# Patient Record
Sex: Male | Born: 1970 | Race: Black or African American | Hispanic: No | Marital: Married | Smoking: Former smoker
Health system: Southern US, Community
[De-identification: ages and names within clinical notes are randomized; demographics above are authoritative.]

## PROBLEM LIST (undated history)

## (undated) DIAGNOSIS — G4730 Sleep apnea, unspecified: Secondary | ICD-10-CM

## (undated) DIAGNOSIS — E785 Hyperlipidemia, unspecified: Secondary | ICD-10-CM

## (undated) HISTORY — DX: Hyperlipidemia, unspecified: E78.5

---

## 2013-06-04 ENCOUNTER — Other Ambulatory Visit: Payer: Self-pay | Admitting: Occupational Medicine

## 2013-06-04 ENCOUNTER — Ambulatory Visit: Payer: Self-pay

## 2013-06-04 DIAGNOSIS — R7612 Nonspecific reaction to cell mediated immunity measurement of gamma interferon antigen response without active tuberculosis: Secondary | ICD-10-CM

## 2013-11-20 HISTORY — PX: COLONOSCOPY: SHX174

## 2013-12-09 ENCOUNTER — Other Ambulatory Visit (HOSPITAL_COMMUNITY): Payer: Self-pay | Admitting: Family Medicine

## 2013-12-09 DIAGNOSIS — R109 Unspecified abdominal pain: Secondary | ICD-10-CM

## 2013-12-12 ENCOUNTER — Ambulatory Visit (HOSPITAL_COMMUNITY)
Admission: RE | Admit: 2013-12-12 | Discharge: 2013-12-12 | Disposition: A | Discharge status: Home / Self Care | Payer: 59 | Source: Ambulatory Visit | Attending: Family Medicine | Admitting: Family Medicine

## 2013-12-12 DIAGNOSIS — R109 Unspecified abdominal pain: Secondary | ICD-10-CM

## 2013-12-12 DIAGNOSIS — R1011 Right upper quadrant pain: Secondary | ICD-10-CM | POA: Insufficient documentation

## 2013-12-31 ENCOUNTER — Other Ambulatory Visit (HOSPITAL_COMMUNITY): Payer: Self-pay | Admitting: Gastroenterology

## 2013-12-31 DIAGNOSIS — R1011 Right upper quadrant pain: Secondary | ICD-10-CM

## 2014-01-09 ENCOUNTER — Encounter (HOSPITAL_COMMUNITY)
Admission: RE | Admit: 2014-01-09 | Discharge: 2014-01-09 | Disposition: A | Discharge status: Home / Self Care | Payer: 59 | Source: Ambulatory Visit | Attending: Gastroenterology | Admitting: Gastroenterology

## 2014-01-09 DIAGNOSIS — R1011 Right upper quadrant pain: Secondary | ICD-10-CM | POA: Insufficient documentation

## 2014-01-09 MED ORDER — TECHNETIUM TC 99M MEBROFENIN IV KIT
5.0000 | PACK | Freq: Once | INTRAVENOUS | Status: AC | PRN
  Administered 2014-01-09: 5 via INTRAVENOUS

## 2014-01-09 MED ORDER — STERILE WATER FOR INJECTION IJ SOLN
INTRAMUSCULAR | Status: AC
  Filled 2014-01-09: qty 10

## 2014-01-09 MED ORDER — SINCALIDE 5 MCG IJ SOLR
INTRAMUSCULAR | Status: AC
  Administered 2014-01-09: 1.6 ug via INTRAVENOUS
  Filled 2014-01-09: qty 5

## 2014-01-09 MED ORDER — SINCALIDE 5 MCG IJ SOLR: 0.0200 ug/kg | Freq: Once | INTRAMUSCULAR | Status: AC

## 2014-03-31 LAB — HM COLONOSCOPY

## 2014-12-23 ENCOUNTER — Other Ambulatory Visit (HOSPITAL_COMMUNITY): Payer: Self-pay | Admitting: Family Medicine

## 2014-12-23 DIAGNOSIS — R1011 Right upper quadrant pain: Secondary | ICD-10-CM

## 2014-12-25 ENCOUNTER — Ambulatory Visit (HOSPITAL_COMMUNITY)
Admission: RE | Admit: 2014-12-25 | Discharge: 2014-12-25 | Disposition: A | Discharge status: Home / Self Care | Payer: 59 | Source: Ambulatory Visit | Attending: Family Medicine | Admitting: Family Medicine

## 2014-12-25 ENCOUNTER — Encounter (HOSPITAL_COMMUNITY): Payer: Self-pay

## 2014-12-25 DIAGNOSIS — R1011 Right upper quadrant pain: Secondary | ICD-10-CM | POA: Diagnosis not present

## 2014-12-25 MED ORDER — IOHEXOL 300 MG/ML  SOLN
100.0000 mL | Freq: Once | INTRAMUSCULAR | Status: AC | PRN
  Administered 2014-12-25: 100 mL via INTRAVENOUS

## 2015-02-21 IMAGING — CT CT ABD-PELV W/ CM
2 of 5 series · 17 of 46 positions shown, 19 images · IV contrast (OMNIPAQUE)
Comparison: Ultrasound 12/12/2013

CLINICAL DATA: Right upper quadrant pain 2 years with recent
worsening.

EXAM:
CT ABDOMEN AND PELVIS WITH CONTRAST
TECHNIQUE: Multidetector CT imaging of the abdomen and pelvis was performed
using the standard protocol following bolus administration of
intravenous contrast.
CONTRAST:  100mL OMNIPAQUE IOHEXOL 300 MG/ML  SOLN

[Series 2: rtn a/p with · axial · 0.71mm/px · z∈[-504,-124]mm · 14 of 86 slices shown, 16 images]
[im 5/86  soft-tissue]
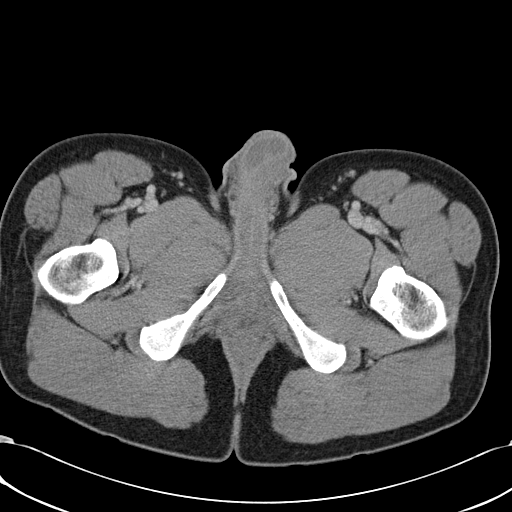
[im 5/86  bone]
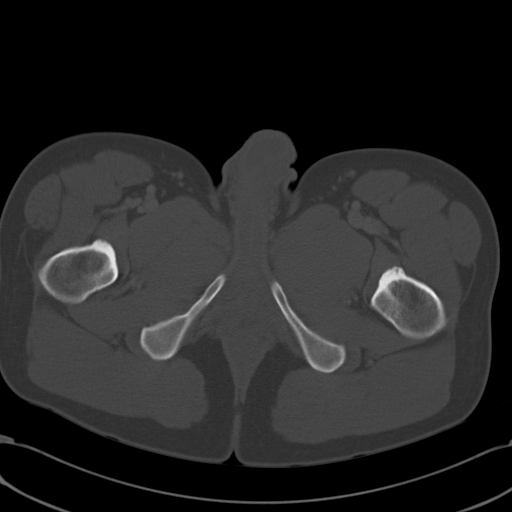
[im 10/86  soft-tissue]
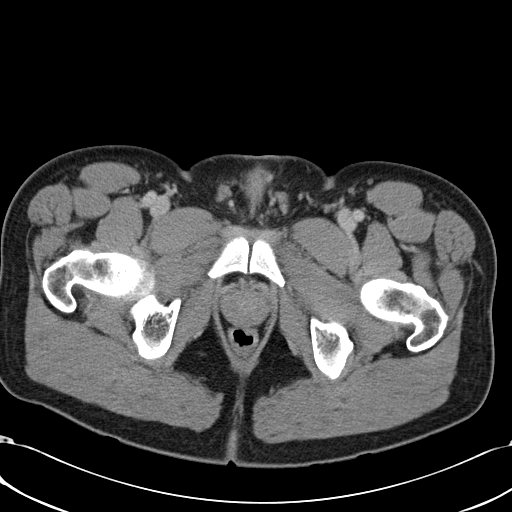
[im 19/86  soft-tissue]
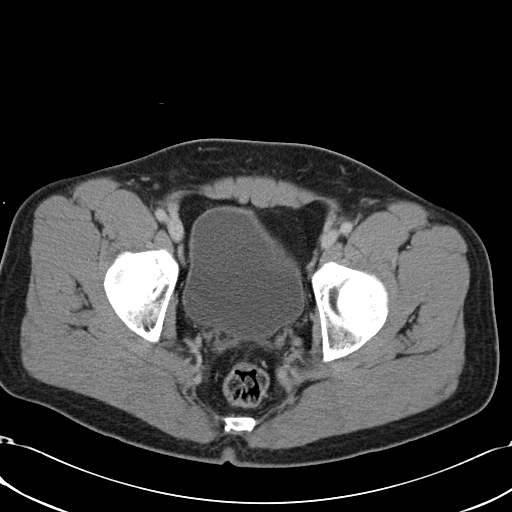
[im 24/86  soft-tissue]
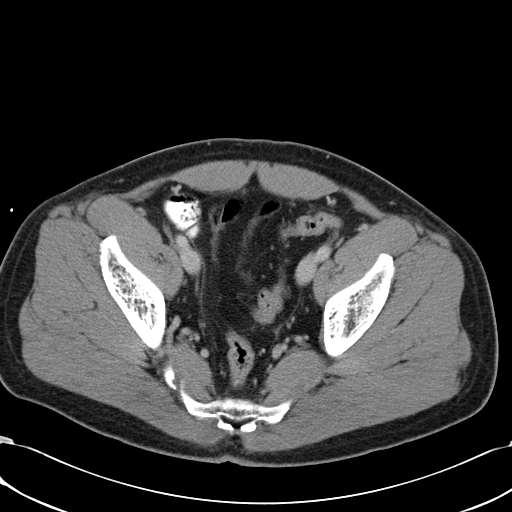
[im 29/86  soft-tissue]
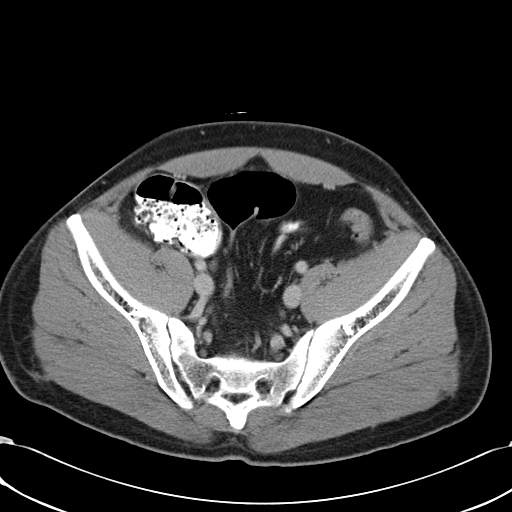
[im 34/86  soft-tissue]
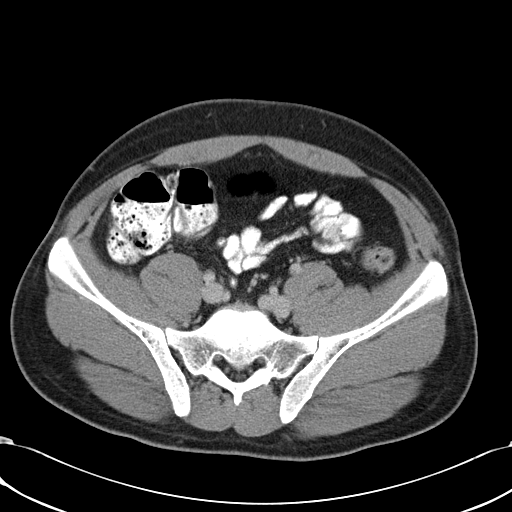
[im 38/86  soft-tissue]
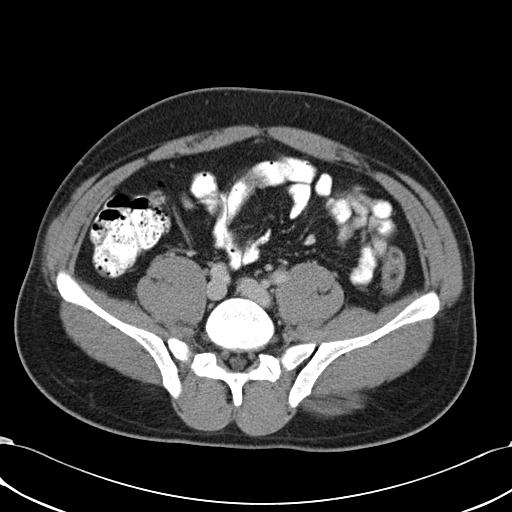
[im 48/86  soft-tissue]
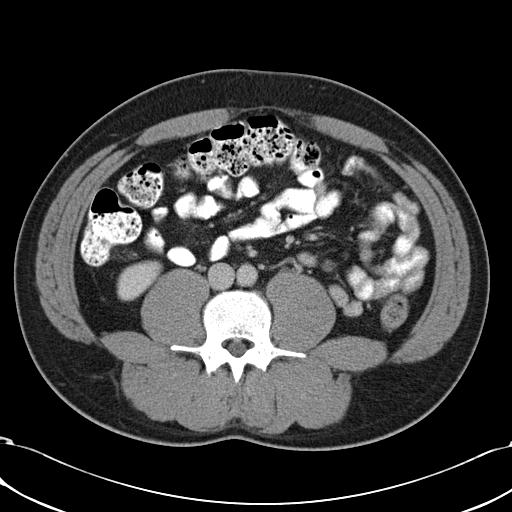
[im 52/86  soft-tissue]
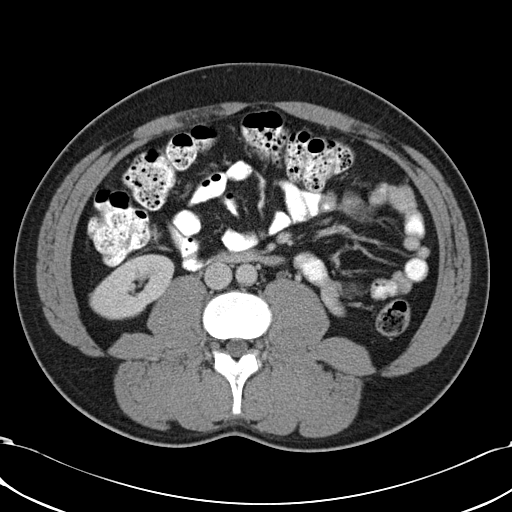
[im 52/86  bone]
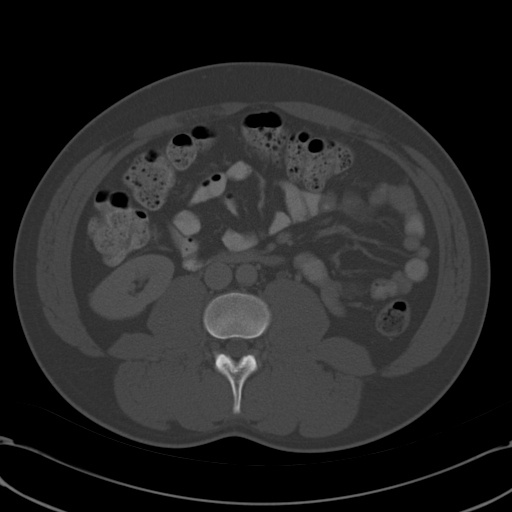
[im 57/86  soft-tissue]
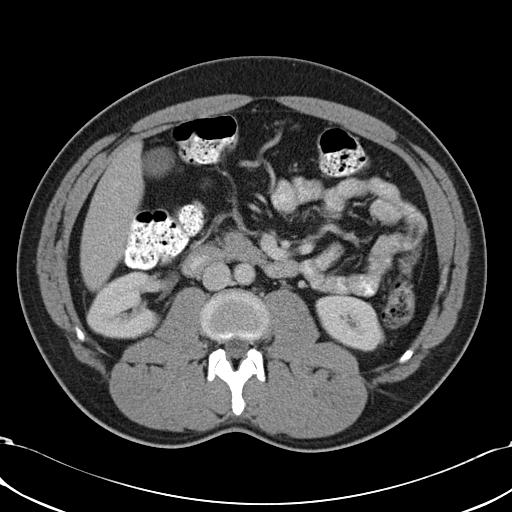
[im 62/86  soft-tissue]
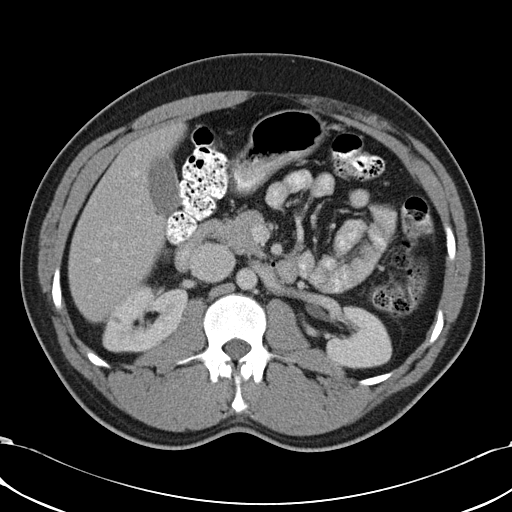
[im 67/86  soft-tissue]
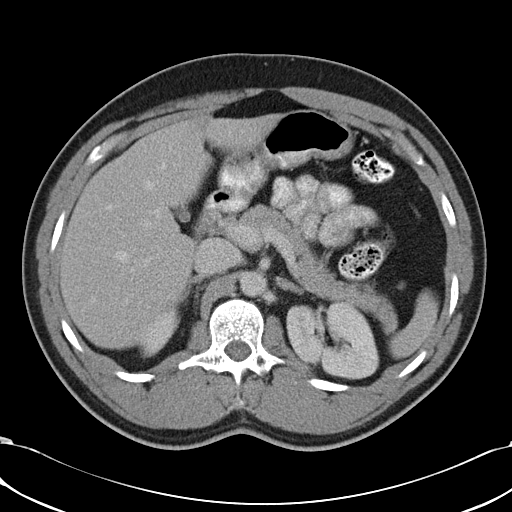
[im 76/86  soft-tissue]
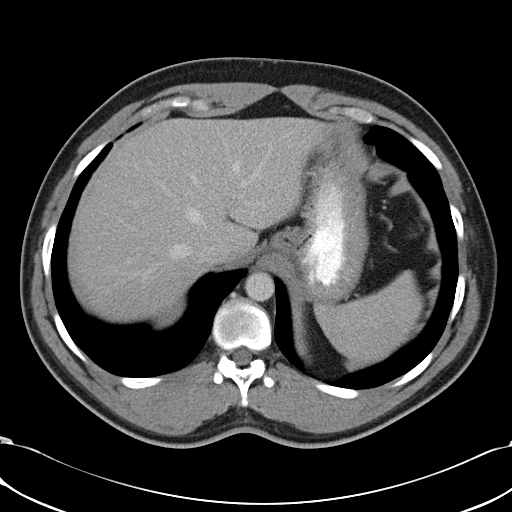
[im 81/86  soft-tissue]
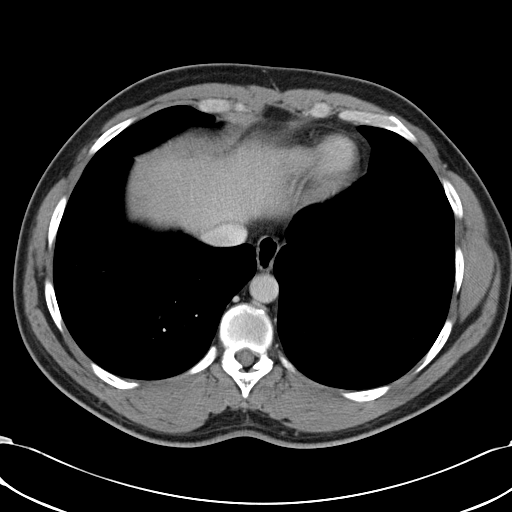

[Series 602: <mpr thick range> · coronal · 0.84mm/px · 3 of 91 slices shown]
[im 31/91  soft-tissue]
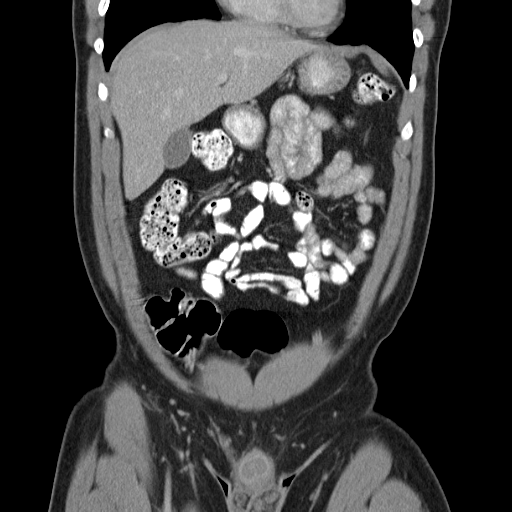
[im 41/91  soft-tissue]
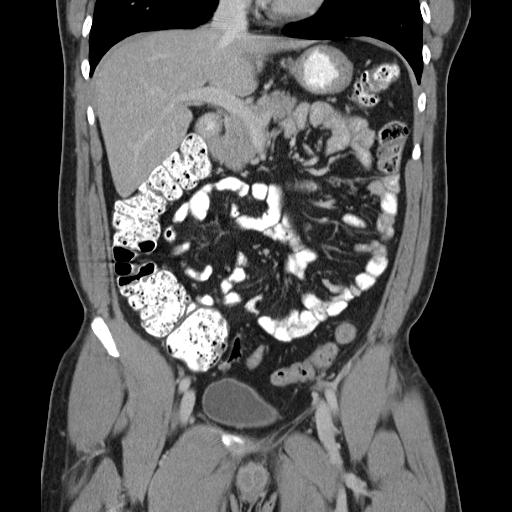
[im 51/91  soft-tissue]
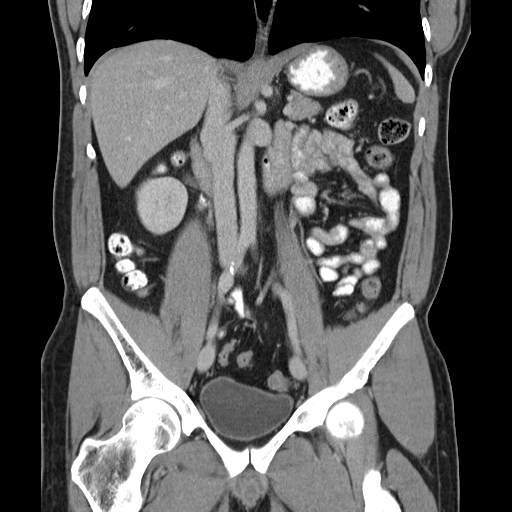

[17 of 46 positions shown; findings below may reference images not displayed]

FINDINGS: The lung bases are within normal.

Abdominal images demonstrate a normal liver, spleen, pancreas,
gallbladder and adrenal glands. Kidneys are normal size, shape and
position without hydronephrosis or nephrolithiasis. There is a sub
cm renal cortical hypodensity over the upper pole right kidney too
small to characterize but likely a cyst. Ureters are normal. The
appendix is normal. Minimal diverticulosis of the sigmoid colon.
Small bowel within normal. Mesenteric within normal. No free fluid
or inflammatory change.

Pelvic images demonstrate the bladder, prostate and rectum to be
within normal. The remaining bones and soft tissues are within
normal.
IMPRESSION: No acute findings in the abdomen/ pelvis.

Sub cm right renal cortical hypodensity too small to characterize
but likely a cyst.

Subtle diverticulosis of the sigmoid colon.

## 2016-01-26 DIAGNOSIS — H524 Presbyopia: Secondary | ICD-10-CM | POA: Diagnosis not present

## 2016-01-26 DIAGNOSIS — H52223 Regular astigmatism, bilateral: Secondary | ICD-10-CM | POA: Diagnosis not present

## 2016-01-26 DIAGNOSIS — H5203 Hypermetropia, bilateral: Secondary | ICD-10-CM | POA: Diagnosis not present

## 2016-10-10 DIAGNOSIS — Z1159 Encounter for screening for other viral diseases: Secondary | ICD-10-CM | POA: Diagnosis not present

## 2016-10-10 DIAGNOSIS — Z114 Encounter for screening for human immunodeficiency virus [HIV]: Secondary | ICD-10-CM | POA: Diagnosis not present

## 2016-10-10 DIAGNOSIS — D170 Benign lipomatous neoplasm of skin and subcutaneous tissue of head, face and neck: Secondary | ICD-10-CM | POA: Diagnosis not present

## 2016-10-10 DIAGNOSIS — Z13220 Encounter for screening for lipoid disorders: Secondary | ICD-10-CM | POA: Diagnosis not present

## 2016-10-10 DIAGNOSIS — Z131 Encounter for screening for diabetes mellitus: Secondary | ICD-10-CM | POA: Diagnosis not present

## 2016-10-10 DIAGNOSIS — Z800 Family history of malignant neoplasm of digestive organs: Secondary | ICD-10-CM | POA: Diagnosis not present

## 2016-10-10 DIAGNOSIS — Z0000 Encounter for general adult medical examination without abnormal findings: Secondary | ICD-10-CM | POA: Diagnosis not present

## 2016-10-10 DIAGNOSIS — Z113 Encounter for screening for infections with a predominantly sexual mode of transmission: Secondary | ICD-10-CM | POA: Diagnosis not present

## 2016-11-09 DIAGNOSIS — G4733 Obstructive sleep apnea (adult) (pediatric): Secondary | ICD-10-CM | POA: Diagnosis not present

## 2016-12-04 DIAGNOSIS — D170 Benign lipomatous neoplasm of skin and subcutaneous tissue of head, face and neck: Secondary | ICD-10-CM | POA: Diagnosis not present

## 2016-12-22 DIAGNOSIS — Z3141 Encounter for fertility testing: Secondary | ICD-10-CM | POA: Diagnosis not present

## 2017-04-13 DIAGNOSIS — R7303 Prediabetes: Secondary | ICD-10-CM | POA: Diagnosis not present

## 2017-04-13 DIAGNOSIS — Z6830 Body mass index (BMI) 30.0-30.9, adult: Secondary | ICD-10-CM | POA: Diagnosis not present

## 2017-04-13 DIAGNOSIS — N649 Disorder of breast, unspecified: Secondary | ICD-10-CM | POA: Diagnosis not present

## 2017-04-13 DIAGNOSIS — Z13220 Encounter for screening for lipoid disorders: Secondary | ICD-10-CM | POA: Diagnosis not present

## 2017-04-17 ENCOUNTER — Other Ambulatory Visit: Payer: Self-pay | Admitting: Family Medicine

## 2017-04-17 DIAGNOSIS — N649 Disorder of breast, unspecified: Secondary | ICD-10-CM

## 2017-04-17 DIAGNOSIS — N63 Unspecified lump in unspecified breast: Secondary | ICD-10-CM

## 2017-04-18 ENCOUNTER — Other Ambulatory Visit: Payer: Self-pay | Admitting: Family Medicine

## 2017-04-18 DIAGNOSIS — N63 Unspecified lump in unspecified breast: Secondary | ICD-10-CM

## 2017-04-18 DIAGNOSIS — N649 Disorder of breast, unspecified: Secondary | ICD-10-CM

## 2017-04-24 ENCOUNTER — Ambulatory Visit
Admission: RE | Admit: 2017-04-24 | Discharge: 2017-04-24 | Disposition: A | Discharge status: Home / Self Care | Payer: 59 | Source: Ambulatory Visit | Attending: Family Medicine | Admitting: Family Medicine

## 2017-04-24 DIAGNOSIS — N63 Unspecified lump in unspecified breast: Secondary | ICD-10-CM

## 2017-04-24 DIAGNOSIS — R928 Other abnormal and inconclusive findings on diagnostic imaging of breast: Secondary | ICD-10-CM | POA: Diagnosis not present

## 2017-04-24 DIAGNOSIS — N649 Disorder of breast, unspecified: Secondary | ICD-10-CM

## 2017-05-04 DIAGNOSIS — Z3189 Encounter for other procreative management: Secondary | ICD-10-CM | POA: Diagnosis not present

## 2017-05-10 DIAGNOSIS — G4733 Obstructive sleep apnea (adult) (pediatric): Secondary | ICD-10-CM | POA: Diagnosis not present

## 2017-05-21 DIAGNOSIS — Z3184 Encounter for fertility preservation procedure: Secondary | ICD-10-CM | POA: Diagnosis not present

## 2017-05-21 DIAGNOSIS — Z3141 Encounter for fertility testing: Secondary | ICD-10-CM | POA: Diagnosis not present

## 2017-05-25 DIAGNOSIS — Z3141 Encounter for fertility testing: Secondary | ICD-10-CM | POA: Diagnosis not present

## 2017-06-26 DIAGNOSIS — Z3189 Encounter for other procreative management: Secondary | ICD-10-CM | POA: Diagnosis not present

## 2017-08-13 DIAGNOSIS — G4733 Obstructive sleep apnea (adult) (pediatric): Secondary | ICD-10-CM | POA: Diagnosis not present

## 2017-08-16 DIAGNOSIS — Z3189 Encounter for other procreative management: Secondary | ICD-10-CM | POA: Diagnosis not present

## 2017-09-20 DIAGNOSIS — Z125 Encounter for screening for malignant neoplasm of prostate: Secondary | ICD-10-CM | POA: Diagnosis not present

## 2017-09-20 DIAGNOSIS — Z0000 Encounter for general adult medical examination without abnormal findings: Secondary | ICD-10-CM | POA: Diagnosis not present

## 2017-09-20 DIAGNOSIS — R7303 Prediabetes: Secondary | ICD-10-CM | POA: Diagnosis not present

## 2018-05-15 ENCOUNTER — Ambulatory Visit (INDEPENDENT_AMBULATORY_CARE_PROVIDER_SITE_OTHER): Payer: No Typology Code available for payment source | Admitting: Internal Medicine

## 2018-05-15 ENCOUNTER — Encounter: Payer: Self-pay | Admitting: Internal Medicine

## 2018-05-15 ENCOUNTER — Other Ambulatory Visit (INDEPENDENT_AMBULATORY_CARE_PROVIDER_SITE_OTHER): Payer: No Typology Code available for payment source

## 2018-05-15 VITALS — BP 122/84 | HR 90 | Temp 98.0°F | Ht >= 80 in | Wt 204.0 lb

## 2018-05-15 DIAGNOSIS — Z0000 Encounter for general adult medical examination without abnormal findings: Secondary | ICD-10-CM

## 2018-05-15 DIAGNOSIS — Z23 Encounter for immunization: Secondary | ICD-10-CM

## 2018-05-15 LAB — COMPREHENSIVE METABOLIC PANEL
ALBUMIN: 4.4 g/dL (ref 3.5–5.2)
ALK PHOS: 51 U/L (ref 39–117)
ALT: 32 U/L (ref 0–53)
AST: 23 U/L (ref 0–37)
BILIRUBIN TOTAL: 0.4 mg/dL (ref 0.2–1.2)
BUN: 15 mg/dL (ref 6–23)
CO2: 30 mEq/L (ref 19–32)
Calcium: 9.8 mg/dL (ref 8.4–10.5)
Chloride: 104 mEq/L (ref 96–112)
Creatinine, Ser: 1.09 mg/dL (ref 0.40–1.50)
GFR: 77.31 mL/min (ref >60.00)
GLUCOSE: 111 mg/dL — AB (ref 70–99)
Potassium: 4 mEq/L (ref 3.5–5.1)
Sodium: 141 mEq/L (ref 135–145)
TOTAL PROTEIN: 7.3 g/dL (ref 6.0–8.3)

## 2018-05-15 LAB — CBC
HCT: 45.3 % (ref 39.0–52.0)
Hemoglobin: 15.4 g/dL (ref 13.0–17.0)
MCHC: 33.9 g/dL (ref 30.0–36.0)
MCV: 88.3 fl (ref 78.0–100.0)
PLATELETS: 152 10*3/uL (ref 150.0–400.0)
RBC: 5.14 Mil/uL (ref 4.22–5.81)
RDW: 13.4 % (ref 11.5–15.5)
WBC: 5.9 10*3/uL (ref 4.0–10.5)

## 2018-05-15 LAB — HEMOGLOBIN A1C: Hgb A1c MFr Bld: 6.2 % (ref 4.6–6.5)

## 2018-05-15 LAB — LIPID PANEL
Cholesterol: 226 mg/dL — ABNORMAL HIGH (ref 0–200)
HDL: 38.6 mg/dL — ABNORMAL LOW (ref >39.00)
LDL Cholesterol: 150 mg/dL — ABNORMAL HIGH (ref 0–99)
NONHDL: 187.18
TRIGLYCERIDES: 187 mg/dL — AB (ref 0.0–149.0)
Total CHOL/HDL Ratio: 6
VLDL: 37.4 mg/dL (ref 0.0–40.0)

## 2018-05-15 NOTE — Patient Instructions (Signed)
We have given you the tetanus shot today and will get the records about the colonoscopy.    Health Maintenance, Male A healthy lifestyle and preventive care is important for your health and wellness. Ask your health care provider about what schedule of regular examinations is right for you. What should I know about weight and diet? Eat a Healthy Diet  Eat plenty of vegetables, fruits, whole grains, low-fat dairy products, and lean protein.  Do not eat a lot of foods high in solid fats, added sugars, or salt.  Maintain a Healthy Weight Regular exercise can help you achieve or maintain a healthy weight. You should:  Do at least 150 minutes of exercise each week. The exercise should increase your heart rate and make you sweat (moderate-intensity exercise).  Do strength-training exercises at least twice a week.  Watch Your Levels of Cholesterol and Blood Lipids  Have your blood tested for lipids and cholesterol every 5 years starting at 46 years of age. If you are at high risk for heart disease, you should start having your blood tested when you are 46 years old. You may need to have your cholesterol levels checked more often if: ? Your lipid or cholesterol levels are high. ? You are older than 46 years of age. ? You are at high risk for heart disease.  What should I know about cancer screening? Many types of cancers can be detected early and may often be prevented. Lung Cancer  You should be screened every year for lung cancer if: ? You are a current smoker who has smoked for at least 30 years. ? You are a former smoker who has quit within the past 15 years.  Talk to your health care provider about your screening options, when you should start screening, and how often you should be screened.  Colorectal Cancer  Routine colorectal cancer screening usually begins at 46 years of age and should be repeated every 5-10 years until you are 46 years old. You may need to be screened more  often if early forms of precancerous polyps or small growths are found. Your health care provider may recommend screening at an earlier age if you have risk factors for colon cancer.  Your health care provider may recommend using home test kits to check for hidden blood in the stool.  A small camera at the end of a tube can be used to examine your colon (sigmoidoscopy or colonoscopy). This checks for the earliest forms of colorectal cancer.  Prostate and Testicular Cancer  Depending on your age and overall health, your health care provider may do certain tests to screen for prostate and testicular cancer.  Talk to your health care provider about any symptoms or concerns you have about testicular or prostate cancer.  Skin Cancer  Check your skin from head to toe regularly.  Tell your health care provider about any new moles or changes in moles, especially if: ? There is a change in a mole's size, shape, or color. ? You have a mole that is larger than a pencil eraser.  Always use sunscreen. Apply sunscreen liberally and repeat throughout the day.  Protect yourself by wearing long sleeves, pants, a wide-brimmed hat, and sunglasses when outside.  What should I know about heart disease, diabetes, and high blood pressure?  If you are 40-95 years of age, have your blood pressure checked every 3-5 years. If you are 34 years of age or older, have your blood pressure checked every year.  You should have your blood pressure measured twice-once when you are at a hospital or clinic, and once when you are not at a hospital or clinic. Record the average of the two measurements. To check your blood pressure when you are not at a hospital or clinic, you can use: ? An automated blood pressure machine at a pharmacy. ? A home blood pressure monitor.  Talk to your health care provider about your target blood pressure.  If you are between 18-23 years old, ask your health care provider if you should take  aspirin to prevent heart disease.  Have regular diabetes screenings by checking your fasting blood sugar level. ? If you are at a normal weight and have a low risk for diabetes, have this test once every three years after the age of 39. ? If you are overweight and have a high risk for diabetes, consider being tested at a younger age or more often.  A one-time screening for abdominal aortic aneurysm (AAA) by ultrasound is recommended for men aged 37-75 years who are current or former smokers. What should I know about preventing infection? Hepatitis B If you have a higher risk for hepatitis B, you should be screened for this virus. Talk with your health care provider to find out if you are at risk for hepatitis B infection. Hepatitis C Blood testing is recommended for:  Everyone born from [birth year] through 1964.  Anyone with known risk factors for hepatitis C.  Sexually Transmitted Diseases (STDs)  You should be screened each year for STDs including gonorrhea and chlamydia if: ? You are sexually active and are younger than 46 years of age. ? You are older than 46 years of age and your health care provider tells you that you are at risk for this type of infection. ? Your sexual activity has changed since you were last screened and you are at an increased risk for chlamydia or gonorrhea. Ask your health care provider if you are at risk.  Talk with your health care provider about whether you are at high risk of being infected with HIV. Your health care provider may recommend a prescription medicine to help prevent HIV infection.  What else can I do?  Schedule regular health, dental, and eye exams.  Stay current with your vaccines (immunizations).  Do not use any tobacco products, such as cigarettes, chewing tobacco, and e-cigarettes. If you need help quitting, ask your health care provider.  Limit alcohol intake to no more than 2 drinks per day. One drink equals 12 ounces of beer, 5 ounces of  wine, or 1 ounces of hard liquor.  Do not use street drugs.  Do not share needles.  Ask your health care provider for help if you need support or information about quitting drugs.  Tell your health care provider if you often feel depressed.  Tell your health care provider if you have ever been abused or do not feel safe at home. This information is not intended to replace advice given to you by your health care provider. Make sure you discuss any questions you have with your health care provider. Document Released: 05/04/2008 Document Revised: 07/05/2016 Document Reviewed: 08/10/2015 Elsevier Interactive Patient Education  [employer].

## 2018-05-15 NOTE — Progress Notes (Signed)
   Subjective:    Patient ID: Brian Pace, male    DOB: 08/06/1971, 47 y.o.   MRN: [id number]  HPI The patient is a new 47 YO man coming in for physical. No new concerns.   PMH, [facility], social history reviewed and updated.   Review of Systems  Constitutional: Negative.   HENT: Negative.   Eyes: Negative.   Respiratory: Negative for cough, chest tightness and shortness of breath.   Cardiovascular: Negative for chest pain, palpitations and leg swelling.  Gastrointestinal: Negative for abdominal distention, abdominal pain, constipation, diarrhea, nausea and vomiting.  Musculoskeletal: Negative.   Skin: Negative.   Neurological: Negative.   Psychiatric/Behavioral: Negative.       Objective:   Physical Exam  Constitutional: He is oriented to person, place, and time. He appears well-developed and well-nourished.  HENT:  Head: Normocephalic and atraumatic.  Eyes: EOM are normal.  Neck: Normal range of motion.  Cardiovascular: Normal rate and regular rhythm.  Pulmonary/Chest: Effort normal and breath sounds normal. No respiratory distress. He has no wheezes. He has no rales.  Abdominal: Soft. Bowel sounds are normal. He exhibits no distension. There is no tenderness. There is no rebound.  Musculoskeletal: He exhibits no edema.  Neurological: He is alert and oriented to person, place, and time. Coordination normal.  Skin: Skin is warm and dry.  Psychiatric: He has a normal mood and affect.   Vitals:   05/15/18 1047  BP: 122/84  Pulse: 90  Temp: 98 F (36.7 C)  TempSrc: Oral  SpO2: 97%  Weight: 204 lb (92.5 kg)  Height: 6\' 8"  (2.032 m)      Assessment & Plan:  Tdap given at visit

## 2018-05-16 ENCOUNTER — Other Ambulatory Visit: Payer: Self-pay | Admitting: Internal Medicine

## 2018-05-16 DIAGNOSIS — Z0000 Encounter for general adult medical examination without abnormal findings: Secondary | ICD-10-CM | POA: Insufficient documentation

## 2018-05-16 DIAGNOSIS — R7303 Prediabetes: Secondary | ICD-10-CM

## 2018-05-16 LAB — HIV ANTIBODY (ROUTINE TESTING W REFLEX): HIV: NONREACTIVE

## 2018-05-16 NOTE — Assessment & Plan Note (Signed)
Tdap given at visit, checking labs. Counseled about sun safety and mole surveillance as well as need for regular exercise. Flu shot yearly through work. Had colonoscopy about 4 years ago and was recommended another in 5 years. Getting records to review for recommended interval. Given screening recommendations.

## 2018-05-24 ENCOUNTER — Encounter: Payer: Self-pay | Admitting: Internal Medicine

## 2018-05-24 NOTE — Progress Notes (Signed)
Abstracted and sent to scan  

## 2018-11-08 ENCOUNTER — Encounter: Payer: Self-pay | Admitting: Internal Medicine

## 2018-11-08 MED ORDER — MEFLOQUINE HCL 250 MG PO TABS: 250.0000 mg | ORAL_TABLET | ORAL | 0 refills | Status: DC

## 2018-11-08 MED ORDER — AMOXICILLIN-POT CLAVULANATE 875-125 MG PO TABS: 1.0000 | ORAL_TABLET | Freq: Two times a day (BID) | ORAL | 0 refills | Status: DC

## 2019-05-19 ENCOUNTER — Ambulatory Visit (INDEPENDENT_AMBULATORY_CARE_PROVIDER_SITE_OTHER): Payer: No Typology Code available for payment source | Admitting: Internal Medicine

## 2019-05-19 ENCOUNTER — Other Ambulatory Visit (INDEPENDENT_AMBULATORY_CARE_PROVIDER_SITE_OTHER): Payer: No Typology Code available for payment source

## 2019-05-19 ENCOUNTER — Encounter: Payer: Self-pay | Admitting: Internal Medicine

## 2019-05-19 ENCOUNTER — Other Ambulatory Visit: Payer: Self-pay

## 2019-05-19 VITALS — BP 110/82 | HR 84 | Temp 98.1°F | Ht 68.0 in | Wt 206.0 lb

## 2019-05-19 DIAGNOSIS — Z0000 Encounter for general adult medical examination without abnormal findings: Secondary | ICD-10-CM | POA: Diagnosis not present

## 2019-05-19 LAB — COMPREHENSIVE METABOLIC PANEL
ALT: 39 U/L (ref 0–53)
AST: 29 U/L (ref 0–37)
Albumin: 4.4 g/dL (ref 3.5–5.2)
Alkaline Phosphatase: 46 U/L (ref 39–117)
BUN: 12 mg/dL (ref 6–23)
CO2: 26 mEq/L (ref 19–32)
Calcium: 9.2 mg/dL (ref 8.4–10.5)
Chloride: 102 mEq/L (ref 96–112)
Creatinine, Ser: 1.11 mg/dL (ref 0.40–1.50)
GFR: 70.92 mL/min (ref >60.00)
Glucose, Bld: 88 mg/dL (ref 70–99)
Potassium: 3.9 mEq/L (ref 3.5–5.1)
Sodium: 136 mEq/L (ref 135–145)
Total Bilirubin: 0.5 mg/dL (ref 0.2–1.2)
Total Protein: 7.4 g/dL (ref 6.0–8.3)

## 2019-05-19 LAB — LIPID PANEL
Cholesterol: 203 mg/dL — ABNORMAL HIGH (ref 0–200)
HDL: 43.2 mg/dL (ref >39.00)
LDL Cholesterol: 129 mg/dL — ABNORMAL HIGH (ref 0–99)
NonHDL: 159.78
Total CHOL/HDL Ratio: 5
Triglycerides: 152 mg/dL — ABNORMAL HIGH (ref 0.0–149.0)
VLDL: 30.4 mg/dL (ref 0.0–40.0)

## 2019-05-19 LAB — CBC
HCT: 44.2 % (ref 39.0–52.0)
Hemoglobin: 15 g/dL (ref 13.0–17.0)
MCHC: 33.9 g/dL (ref 30.0–36.0)
MCV: 88.8 fl (ref 78.0–100.0)
Platelets: 179 10*3/uL (ref 150.0–400.0)
RBC: 4.98 Mil/uL (ref 4.22–5.81)
RDW: 13.3 % (ref 11.5–15.5)
WBC: 4.4 10*3/uL (ref 4.0–10.5)

## 2019-05-19 LAB — HEMOGLOBIN A1C: Hgb A1c MFr Bld: 5.9 % (ref 4.6–6.5)

## 2019-05-19 NOTE — Patient Instructions (Signed)

## 2019-05-19 NOTE — Progress Notes (Signed)
   Subjective:   Patient ID: Brian Pace, male    DOB: [birth year], 48 y.o.   MRN: [id number]  HPI The patient is a 48 YO man coming in for physical. Exercising daily.   PMH, [facility], social history reviewed and updated  Review of Systems  Constitutional: Negative.   HENT: Negative.   Eyes: Negative.   Respiratory: Negative for cough, chest tightness and shortness of breath.   Cardiovascular: Negative for chest pain, palpitations and leg swelling.  Gastrointestinal: Negative for abdominal distention, abdominal pain, constipation, diarrhea, nausea and vomiting.  Musculoskeletal: Negative.   Skin: Negative.   Neurological: Negative.   Psychiatric/Behavioral: Negative.     Objective:  Physical Exam Constitutional:      Appearance: He is well-developed.  HENT:     Head: Normocephalic and atraumatic.  Neck:     Musculoskeletal: Normal range of motion.  Cardiovascular:     Rate and Rhythm: Normal rate and regular rhythm.  Pulmonary:     Effort: Pulmonary effort is normal. No respiratory distress.     Breath sounds: Normal breath sounds. No wheezing or rales.  Abdominal:     General: Bowel sounds are normal. There is no distension.     Palpations: Abdomen is soft.     Tenderness: There is no abdominal tenderness. There is no rebound.  Skin:    General: Skin is warm and dry.  Neurological:     Mental Status: He is alert and oriented to person, place, and time.     Coordination: Coordination normal.     Vitals:   05/19/19 1302  BP: 110/82  Pulse: 84  Temp: 98.1 F (36.7 C)  TempSrc: Oral  SpO2: 98%  Weight: 206 lb (93.4 kg)  Height: 6\' 8"  (2.032 m)    Assessment & Plan:

## 2019-05-19 NOTE — Assessment & Plan Note (Signed)
Flu shot yearly. Tetanus up to date. Colonoscopy done 2015 and need pathology for recommendation about next. Counseled about sun safety and mole surveillance. Counseled about the dangers of distracted driving. Given 10 year screening recommendations.

## 2019-05-20 ENCOUNTER — Encounter: Payer: Self-pay | Admitting: Internal Medicine

## 2019-05-20 LAB — SARS-COV-2 ANTIBODY, IGM: SARS-CoV-2 Antibody, IgM: NEGATIVE

## 2019-10-08 ENCOUNTER — Encounter: Payer: Self-pay | Admitting: Internal Medicine

## 2019-10-13 MED ORDER — MEFLOQUINE HCL 250 MG PO TABS: 250.0000 mg | ORAL_TABLET | ORAL | 0 refills | Status: DC

## 2019-10-13 MED ORDER — AMOXICILLIN-POT CLAVULANATE 875-125 MG PO TABS: 1.0000 | ORAL_TABLET | Freq: Two times a day (BID) | ORAL | 0 refills | Status: DC

## 2019-10-18 ENCOUNTER — Other Ambulatory Visit: Payer: Self-pay

## 2019-10-18 ENCOUNTER — Other Ambulatory Visit (HOSPITAL_COMMUNITY)
Admit: 2019-10-18 | Discharge: 2019-10-18 | Disposition: A | Discharge status: Home / Self Care | Payer: No Typology Code available for payment source | Source: Other Acute Inpatient Hospital | Attending: Critical Care Medicine | Admitting: Critical Care Medicine

## 2019-10-18 DIAGNOSIS — Z20828 Contact with and (suspected) exposure to other viral communicable diseases: Secondary | ICD-10-CM | POA: Diagnosis not present

## 2019-10-18 DIAGNOSIS — Z20822 Contact with and (suspected) exposure to covid-19: Secondary | ICD-10-CM

## 2019-10-20 LAB — NOVEL CORONAVIRUS, NAA (HOSP ORDER, SEND-OUT TO REF LAB; TAT 18-24 HRS): SARS-CoV-2, NAA: NOT DETECTED

## 2020-01-18 ENCOUNTER — Ambulatory Visit: Payer: No Typology Code available for payment source | Attending: Internal Medicine

## 2020-01-18 DIAGNOSIS — Z23 Encounter for immunization: Secondary | ICD-10-CM | POA: Insufficient documentation

## 2020-01-18 NOTE — Progress Notes (Signed)
   Covid-19 Vaccination Clinic  Name:  Brian Pace    MRN: [id number] DOB: 1971-10-03  01/18/2020  Mr. Fichtel was observed post Covid-19 immunization for 15 minutes without incidence. He was provided with Vaccine Information Sheet and instruction to access the V-Safe system.   Mr. Mccullagh was instructed to call 911 with any severe reactions post vaccine: Marland Kitchen Difficulty breathing  . Swelling of your face and throat  . A fast heartbeat  . A bad rash all over your body  . Dizziness and weakness    Immunizations Administered    Name Date Dose VIS Date Route   Pfizer COVID-19 Vaccine 01/18/2020 12:16 PM 0.3 mL 10/31/2019 Intramuscular   Manufacturer: [place]   Lot: [id number]   [place]: [id number]

## 2020-03-23 ENCOUNTER — Encounter: Payer: Self-pay | Admitting: Internal Medicine

## 2020-03-23 DIAGNOSIS — R0683 Snoring: Secondary | ICD-10-CM

## 2020-06-28 ENCOUNTER — Other Ambulatory Visit: Payer: Self-pay

## 2020-06-28 ENCOUNTER — Encounter: Payer: Self-pay | Admitting: Internal Medicine

## 2020-06-28 ENCOUNTER — Ambulatory Visit (INDEPENDENT_AMBULATORY_CARE_PROVIDER_SITE_OTHER): Payer: No Typology Code available for payment source | Admitting: Internal Medicine

## 2020-06-28 VITALS — BP 126/84 | HR 94 | Temp 98.1°F | Ht 68.0 in | Wt 206.0 lb

## 2020-06-28 DIAGNOSIS — Z1211 Encounter for screening for malignant neoplasm of colon: Secondary | ICD-10-CM

## 2020-06-28 DIAGNOSIS — Z0000 Encounter for general adult medical examination without abnormal findings: Secondary | ICD-10-CM

## 2020-06-28 NOTE — Addendum Note (Signed)
Addended by: Steward Ros on: 06/28/2020 10:31 AM   Modules accepted: Orders

## 2020-06-28 NOTE — Assessment & Plan Note (Signed)
Flu shot yearly. Covid-19 up to date. Tetanus up to date. Colonoscopy referral to GI. Counseled about sun safety and mole surveillance. Counseled about the dangers of distracted driving. Given 10 year screening recommendations.

## 2020-06-28 NOTE — Patient Instructions (Signed)

## 2020-06-28 NOTE — Progress Notes (Signed)
   Subjective:   Patient ID: Brian Pace, male    DOB: 1971-07-05, 49 y.o.   MRN: [id number]  HPI The patient is a 49 YO man coming in for physical.   PMH, [place], social history reviewed and updated  Review of Systems  Constitutional: Negative.   HENT: Negative.   Eyes: Negative.   Respiratory: Negative for cough, chest tightness and shortness of breath.   Cardiovascular: Negative for chest pain, palpitations and leg swelling.  Gastrointestinal: Negative for abdominal distention, abdominal pain, constipation, diarrhea, nausea and vomiting.  Musculoskeletal: Negative.   Skin: Negative.   Neurological: Negative.   Psychiatric/Behavioral: Negative.     Objective:  Physical Exam Constitutional:      Appearance: He is well-developed.  HENT:     Head: Normocephalic and atraumatic.  Neck:     Comments: Stable cyst left neck posterior, no change from 2020 Cardiovascular:     Rate and Rhythm: Normal rate and regular rhythm.  Pulmonary:     Effort: Pulmonary effort is normal. No respiratory distress.     Breath sounds: Normal breath sounds. No wheezing or rales.  Abdominal:     General: Bowel sounds are normal. There is no distension.     Palpations: Abdomen is soft.     Tenderness: There is no abdominal tenderness. There is no rebound.  Musculoskeletal:     Cervical back: Normal range of motion.  Skin:    General: Skin is warm and dry.  Neurological:     Mental Status: He is alert and oriented to person, place, and time.     Coordination: Coordination normal.     Vitals:   06/28/20 1003  BP: 126/84  Pulse: 94  Temp: 98.1 F (36.7 C)  TempSrc: Oral  SpO2: 98%  Weight: 206 lb (93.4 kg)  Height: 5\' 8"  (1.727 m)    This visit occurred during the SARS-CoV-2 public health emergency.  Safety protocols were in place, including screening questions prior to the visit, additional usage of staff PPE, and extensive cleaning of exam room while observing appropriate contact  time as indicated for disinfecting solutions.   Assessment & Plan:

## 2020-06-29 LAB — COMPREHENSIVE METABOLIC PANEL
AG Ratio: 1.6 (calc) (ref 1.0–2.5)
ALT: 36 U/L (ref 9–46)
AST: 35 U/L (ref 10–40)
Albumin: 4.4 g/dL (ref 3.6–5.1)
Alkaline phosphatase (APISO): 52 U/L (ref 36–130)
BUN: 12 mg/dL (ref 7–25)
CO2: 20 mmol/L (ref 20–32)
Calcium: 9.7 mg/dL (ref 8.6–10.3)
Chloride: 102 mmol/L (ref 98–110)
Creat: 1.23 mg/dL (ref 0.60–1.35)
Globulin: 2.7 g/dL (calc) (ref 1.9–3.7)
Glucose, Bld: 99 mg/dL (ref 65–99)
Potassium: 4.5 mmol/L (ref 3.5–5.3)
Sodium: 136 mmol/L (ref 135–146)
Total Bilirubin: 0.5 mg/dL (ref 0.2–1.2)
Total Protein: 7.1 g/dL (ref 6.1–8.1)

## 2020-06-29 LAB — LIPID PANEL
Cholesterol: 222 mg/dL — ABNORMAL HIGH (ref <200)
HDL: 47 mg/dL (ref >=40)
LDL Cholesterol (Calc): 151 mg/dL (calc) — ABNORMAL HIGH
Non-HDL Cholesterol (Calc): 175 mg/dL (calc) — ABNORMAL HIGH (ref <130)
Total CHOL/HDL Ratio: 4.7 (calc) (ref <5.0)
Triglycerides: 121 mg/dL (ref <150)

## 2020-06-29 LAB — CBC
HCT: 46.3 % (ref 38.5–50.0)
Hemoglobin: 15.6 g/dL (ref 13.2–17.1)
MCH: 29.5 pg (ref 27.0–33.0)
MCHC: 33.7 g/dL (ref 32.0–36.0)
MCV: 87.5 fL (ref 80.0–100.0)
MPV: 12 fL (ref 7.5–12.5)
Platelets: 161 10*3/uL (ref 140–400)
RBC: 5.29 10*6/uL (ref 4.20–5.80)
RDW: 13.3 % (ref 11.0–15.0)
WBC: 3.9 10*3/uL (ref 3.8–10.8)

## 2020-06-29 LAB — HEMOGLOBIN A1C
Hgb A1c MFr Bld: 6 % of total Hgb — ABNORMAL HIGH (ref <5.7)
Mean Plasma Glucose: 126 (calc)
eAG (mmol/L): 7 (calc)

## 2020-06-29 LAB — HEPATITIS C ANTIBODY
Hepatitis C Ab: NONREACTIVE
SIGNAL TO CUT-OFF: 0.05 (ref <1.00)

## 2020-06-29 LAB — PSA: PSA: 0.6 ng/mL (ref <=4.0)

## 2020-07-05 ENCOUNTER — Encounter: Payer: No Typology Code available for payment source | Admitting: Internal Medicine

## 2020-07-29 ENCOUNTER — Encounter: Payer: Self-pay | Admitting: Internal Medicine

## 2020-08-05 ENCOUNTER — Encounter: Payer: Self-pay | Admitting: Gastroenterology

## 2020-09-24 ENCOUNTER — Other Ambulatory Visit (HOSPITAL_COMMUNITY): Payer: Self-pay | Admitting: Gastroenterology

## 2020-09-24 ENCOUNTER — Other Ambulatory Visit: Payer: Self-pay

## 2020-09-24 ENCOUNTER — Ambulatory Visit (AMBULATORY_SURGERY_CENTER): Payer: Self-pay

## 2020-09-24 VITALS — Ht 68.0 in | Wt 202.0 lb

## 2020-09-24 DIAGNOSIS — Z86010 Personal history of colonic polyps: Secondary | ICD-10-CM

## 2020-09-24 MED ORDER — NA SULFATE-K SULFATE-MG SULF 17.5-3.13-1.6 GM/177ML PO SOLN: 1.0000 | Freq: Once | ORAL | 0 refills | Status: AC

## 2020-09-24 NOTE — Progress Notes (Signed)
No allergies to soy or egg Pt is not on blood thinners or diet pills Unable to assess effects of  Sedation/intubation outside of last colonoscopy which he had no problems Denies atrial flutter/fib Denies constipation   Emmi instructions given to pt  Pt is aware of Covid safety and care partner requirements.

## 2020-09-30 ENCOUNTER — Encounter: Payer: Self-pay | Admitting: Gastroenterology

## 2020-10-08 ENCOUNTER — Ambulatory Visit (AMBULATORY_SURGERY_CENTER): Payer: No Typology Code available for payment source | Admitting: Gastroenterology

## 2020-10-08 ENCOUNTER — Encounter: Payer: Self-pay | Admitting: Gastroenterology

## 2020-10-08 ENCOUNTER — Other Ambulatory Visit: Payer: Self-pay

## 2020-10-08 VITALS — BP 131/89 | HR 73 | Temp 98.9°F | Resp 17 | Ht 68.0 in | Wt 202.0 lb

## 2020-10-08 DIAGNOSIS — D123 Benign neoplasm of transverse colon: Secondary | ICD-10-CM

## 2020-10-08 DIAGNOSIS — Z86010 Personal history of colonic polyps: Secondary | ICD-10-CM | POA: Diagnosis not present

## 2020-10-08 MED ORDER — SODIUM CHLORIDE 0.9 % IV SOLN: 500.0000 mL | Freq: Once | INTRAVENOUS | Status: DC

## 2020-10-08 NOTE — Progress Notes (Signed)
Medical history reviewed with no changes noted. VS assessed by C.W 

## 2020-10-08 NOTE — Op Note (Signed)
[place] Patient Name: Brian Pace Procedure Date: 10/08/2020 10:38 AM MRN: [id number] Endoscopist: Thornton Park MD, MD Age: 49 Referring MD:  Date of Birth: 11-Apr-1971 Gender: Male Account #: [id number] Procedure:                Colonoscopy Indications:              Surveillance: Personal history of adenomatous                            polyps on last colonoscopy > 5 years ago                           72mm tubular adenoma removed on colonoscopy with Dr.                            Penelope Coop 2015                           No known family history of colon cancer or polyps Medicines:                Monitored Anesthesia Care Procedure:                Pre-Anesthesia Assessment:                           - Prior to the procedure, a History and Physical                            was performed, and patient medications and                            allergies were reviewed. The patient's tolerance of                            previous anesthesia was also reviewed. The risks                            and benefits of the procedure and the sedation                            options and risks were discussed with the patient.                            All questions were answered, and informed consent                            was obtained. Prior Anticoagulants: The patient has                            taken no previous anticoagulant or antiplatelet                            agents. ASA Grade Assessment: II - A patient with  mild systemic disease. After reviewing the risks                            and benefits, the patient was deemed in                            satisfactory condition to undergo the procedure.                           After obtaining informed consent, the colonoscope                            was passed under direct vision. Throughout the                            procedure, the patient's blood pressure, pulse, and                             oxygen saturations were monitored continuously. The                            Colonoscope was introduced through the anus and                            advanced to the 3 cm into the ileum. The                            colonoscopy was performed with moderate difficulty                            due to a redundant colon and significant looping.                            Successful completion of the procedure was aided by                            applying abdominal pressure. The patient tolerated                            the procedure well. The quality of the bowel                            preparation was good. The terminal ileum, ileocecal                            valve, appendiceal orifice, and rectum were                            photographed. Scope In: 10:52:46 AM Scope Out: 11:10:20 AM Scope Withdrawal Time: 0 hours 9 minutes 8 seconds  Total Procedure Duration: 0 hours 17 minutes 34 seconds  Findings:                 The perianal and digital rectal examinations were  normal.                           A 2 mm polyp was found in the descending colon. The                            polyp was sessile. The polyp was removed with a                            cold biopsy forceps. Resection and retrieval were                            complete. Estimated blood loss: Minimal.                           Multiple small and large-mouthed diverticula were                            found in the entire colon.                           The colon is tortuous and redundant. The exam was                            otherwise without abnormality on direct and                            retroflexion views. Complications:            No immediate complications. Estimated blood loss:                            Minimal. Estimated Blood Loss:     Estimated blood loss was minimal. Impression:               - One 2 mm polyp in the descending colon,  removed                            with a cold biopsy forceps. Resected and retrieved.                           - Diverticulosis in the entire examined colon.                           - The examination was otherwise normal on direct                            and retroflexion views. Recommendation:           - Patient has a contact number available for                            emergencies. The signs and symptoms of potential                            delayed complications were  discussed with the                            patient. Return to normal activities tomorrow.                            Written discharge instructions were provided to the                            patient.                           - Follow a high fiber diet. Drink at least 64                            ounces of water daily. Add a daily stool bulking                            agent such as psyllium (an exampled would be                            Metamucil).                           - Continue present medications.                           - Await pathology results.                           - Repeat colonoscopy date to be determined after                            pending pathology results are reviewed for                            surveillance.                           - Emerging evidence supports eating a diet of                            fruits, vegetables, grains, calcium, and yogurt                            while reducing red meat and alcohol may reduce the                            risk of colon cancer.                           - Thank you for allowing me to be involved in your                            colon cancer prevention. Thornton Park MD, MD 10/08/2020 11:16:25 AM This report has been  signed electronically.

## 2020-10-08 NOTE — Progress Notes (Signed)
To PACU, VSS. Report to [email or web address] 

## 2020-10-08 NOTE — Progress Notes (Signed)
Called to room to assist during endoscopic procedure.  Patient ID and intended procedure confirmed with present staff. Received instructions for my participation in the procedure from the performing physician.  

## 2020-10-08 NOTE — Patient Instructions (Signed)
YOU HAD AN ENDOSCOPIC PROCEDURE TODAY AT THE [facility]:   Refer to the procedure report that was given to you for any specific questions about what was found during the examination.  If the procedure report does not answer your questions, please call your gastroenterologist to clarify.  If you requested that your care partner not be given the details of your procedure findings, then the procedure report has been included in a sealed envelope for you to review at your convenience later.  YOU SHOULD EXPECT: Some feelings of bloating in the abdomen. Passage of more gas than usual.  Walking can help get rid of the air that was put into your GI tract during the procedure and reduce the bloating. If you had a lower endoscopy (such as a colonoscopy or flexible sigmoidoscopy) you may notice spotting of blood in your stool or on the toilet paper. If you underwent a bowel prep for your procedure, you may not have a normal bowel movement for a few days.  Please Note:  You might notice some irritation and congestion in your nose or some drainage.  This is from the oxygen used during your procedure.  There is no need for concern and it should clear up in a day or so.  SYMPTOMS TO REPORT IMMEDIATELY:   Following lower endoscopy (colonoscopy or flexible sigmoidoscopy):  Excessive amounts of blood in the stool  Significant tenderness or worsening of abdominal pains  Swelling of the abdomen that is new, acute  Fever of 100F or higher  For urgent or emergent issues, a gastroenterologist can be reached at any hour by calling [phone]. Do not use MyChart messaging for urgent concerns.    DIET:  We do recommend a small meal at first, but then you may proceed to your regular diet.  Drink plenty of fluids but you should avoid alcoholic beverages for 24 hours.  ACTIVITY:  You should plan to take it easy for the rest of today and you should NOT DRIVE or use heavy machinery until tomorrow (because  of the sedation medicines used during the test).    FOLLOW UP: Our staff will call the number listed on your records 48-72 hours following your procedure to check on you and address any questions or concerns that you may have regarding the information given to you following your procedure. If we do not reach you, we will leave a message.  We will attempt to reach you two times.  During this call, we will ask if you have developed any symptoms of COVID 19. If you develop any symptoms (ie: fever, flu-like symptoms, shortness of breath, cough etc.) before then, please call [phone].  If you test positive for Covid 19 in the 2 weeks post procedure, please call and report this information to us.    If any biopsies were taken you will be contacted by phone or by letter within the next 1-3 weeks.  Please call us at [phone] if you have not heard about the biopsies in 3 weeks.    SIGNATURES/CONFIDENTIALITY: You and/or your care partner have signed paperwork which will be entered into your electronic medical record.  These signatures attest to the fact that that the information above on your After Visit Summary has been reviewed and is understood.  Full responsibility of the confidentiality of this discharge information lies with you and/or your care-partner. 

## 2020-10-12 ENCOUNTER — Telehealth: Payer: Self-pay | Admitting: *Deleted

## 2020-10-12 ENCOUNTER — Telehealth: Payer: Self-pay

## 2020-10-12 NOTE — Telephone Encounter (Signed)
First post procedure follow up call, no answer 

## 2020-10-12 NOTE — Telephone Encounter (Signed)
  Follow up Call-  Call back number 10/08/2020  Post procedure Call Back phone  # [phone]  Permission to leave phone message Yes  Some recent data might be hidden     Patient questions:  Do you have a fever, pain , or abdominal swelling? No. Pain Score  0 *  Have you tolerated food without any problems? Yes.    Have you been able to return to your normal activities? Yes.    Do you have any questions about your discharge instructions: Diet   No. Medications  No. Follow up visit  No.  Do you have questions or concerns about your Care? No.  Actions: * If pain score is 4 or above: No action needed, pain <4.  1. Have you developed a fever since your procedure? no  2.   Have you had an respiratory symptoms (SOB or cough) since your procedure? no  3.   Have you tested positive for COVID 19 since your procedure no  4.   Have you had any family members/close contacts diagnosed with the COVID 19 since your procedure?  no   If yes to any of these questions please route to Joylene John, RN and Joella Prince, RN

## 2020-10-18 ENCOUNTER — Encounter: Payer: Self-pay | Admitting: Gastroenterology

## 2021-02-19 ENCOUNTER — Encounter: Payer: Self-pay | Admitting: Internal Medicine

## 2021-02-21 ENCOUNTER — Other Ambulatory Visit (HOSPITAL_COMMUNITY): Payer: Self-pay

## 2021-02-21 MED ORDER — AMOXICILLIN-POT CLAVULANATE 875-125 MG PO TABS
1.0000 | ORAL_TABLET | Freq: Two times a day (BID) | ORAL | 0 refills | Status: DC
  Filled 2021-02-21: qty 10, 5d supply, fill #0

## 2021-02-21 MED ORDER — MEFLOQUINE HCL 250 MG PO TABS
250.0000 mg | ORAL_TABLET | ORAL | 0 refills | Status: DC
  Filled 2021-02-21: qty 4, 28d supply, fill #0

## 2021-02-22 ENCOUNTER — Other Ambulatory Visit (HOSPITAL_COMMUNITY): Payer: Self-pay

## 2021-05-16 ENCOUNTER — Other Ambulatory Visit (HOSPITAL_COMMUNITY): Payer: Self-pay

## 2021-06-03 ENCOUNTER — Other Ambulatory Visit (HOSPITAL_COMMUNITY): Payer: Self-pay

## 2021-06-03 MED ORDER — CARESTART COVID-19 HOME TEST VI KIT
PACK | 0 refills | Status: DC
  Filled 2021-06-03: qty 4, 4d supply, fill #0

## 2021-07-13 ENCOUNTER — Encounter: Payer: Self-pay | Admitting: Internal Medicine

## 2021-07-13 ENCOUNTER — Other Ambulatory Visit: Payer: Self-pay

## 2021-07-13 ENCOUNTER — Ambulatory Visit (INDEPENDENT_AMBULATORY_CARE_PROVIDER_SITE_OTHER): Payer: No Typology Code available for payment source | Admitting: Internal Medicine

## 2021-07-13 VITALS — BP 118/82 | HR 84 | Temp 97.8°F | Resp 18 | Ht 68.0 in | Wt 206.2 lb

## 2021-07-13 DIAGNOSIS — Z0000 Encounter for general adult medical examination without abnormal findings: Secondary | ICD-10-CM

## 2021-07-13 DIAGNOSIS — D170 Benign lipomatous neoplasm of skin and subcutaneous tissue of head, face and neck: Secondary | ICD-10-CM | POA: Diagnosis not present

## 2021-07-13 DIAGNOSIS — R7303 Prediabetes: Secondary | ICD-10-CM

## 2021-07-13 LAB — COMPREHENSIVE METABOLIC PANEL
ALT: 44 U/L (ref 0–53)
AST: 35 U/L (ref 0–37)
Albumin: 4.4 g/dL (ref 3.5–5.2)
Alkaline Phosphatase: 50 U/L (ref 39–117)
BUN: 12 mg/dL (ref 6–23)
CO2: 27 mEq/L (ref 19–32)
Calcium: 9.8 mg/dL (ref 8.4–10.5)
Chloride: 100 mEq/L (ref 96–112)
Creatinine, Ser: 1.24 mg/dL (ref 0.40–1.50)
GFR: 68.31 mL/min (ref >60.00)
Glucose, Bld: 104 mg/dL — ABNORMAL HIGH (ref 70–99)
Potassium: 4.7 mEq/L (ref 3.5–5.1)
Sodium: 136 mEq/L (ref 135–145)
Total Bilirubin: 0.6 mg/dL (ref 0.2–1.2)
Total Protein: 7.7 g/dL (ref 6.0–8.3)

## 2021-07-13 LAB — LIPID PANEL
Cholesterol: 225 mg/dL — ABNORMAL HIGH (ref 0–200)
HDL: 49.9 mg/dL (ref >39.00)
LDL Cholesterol: 153 mg/dL — ABNORMAL HIGH (ref 0–99)
NonHDL: 175.35
Total CHOL/HDL Ratio: 5
Triglycerides: 110 mg/dL (ref 0.0–149.0)
VLDL: 22 mg/dL (ref 0.0–40.0)

## 2021-07-13 LAB — CBC
HCT: 46.8 % (ref 39.0–52.0)
Hemoglobin: 15.5 g/dL (ref 13.0–17.0)
MCHC: 33.1 g/dL (ref 30.0–36.0)
MCV: 90.2 fl (ref 78.0–100.0)
Platelets: 152 10*3/uL (ref 150.0–400.0)
RBC: 5.19 Mil/uL (ref 4.22–5.81)
RDW: 14.1 % (ref 11.5–15.5)
WBC: 4.2 10*3/uL (ref 4.0–10.5)

## 2021-07-13 LAB — HEMOGLOBIN A1C: Hgb A1c MFr Bld: 6.2 % (ref 4.6–6.5)

## 2021-07-13 NOTE — Assessment & Plan Note (Signed)
Checking HgA1c and adjust as needed.  

## 2021-07-13 NOTE — Progress Notes (Signed)
   Subjective:   Patient ID: Brian Pace, male    DOB: 20-Jun-1971, 50 y.o.   MRN: [id number]  HPI The patient is a 50 YO man coming in for physical.   PMH, [place], social history reviewed and updated  Review of Systems  Constitutional: Negative.   HENT: Negative.    Eyes: Negative.   Respiratory:  Negative for cough, chest tightness and shortness of breath.   Cardiovascular:  Negative for chest pain, palpitations and leg swelling.  Gastrointestinal:  Negative for abdominal distention, abdominal pain, constipation, diarrhea, nausea and vomiting.  Musculoskeletal: Negative.   Skin: Negative.   Neurological: Negative.   Psychiatric/Behavioral: Negative.     Objective:  Physical Exam Constitutional:      Appearance: He is well-developed.  HENT:     Head: Normocephalic and atraumatic.  Cardiovascular:     Rate and Rhythm: Normal rate and regular rhythm.  Pulmonary:     Effort: Pulmonary effort is normal. No respiratory distress.     Breath sounds: Normal breath sounds. No wheezing or rales.  Abdominal:     General: Bowel sounds are normal. There is no distension.     Palpations: Abdomen is soft.     Tenderness: There is no abdominal tenderness. There is no rebound.  Musculoskeletal:     Cervical back: Normal range of motion.  Skin:    General: Skin is warm and dry.     Comments: Lipoma left neck about 2-3 cm circular  Neurological:     Mental Status: He is alert and oriented to person, place, and time.     Coordination: Coordination normal.    Vitals:   07/13/21 0854  BP: 118/82  Pulse: 84  Resp: 18  Temp: 97.8 F (36.6 C)  TempSrc: Oral  SpO2: 97%  Weight: 206 lb 3.2 oz (93.5 kg)  Height: '5\' 8"'$  (1.727 m)    This visit occurred during the SARS-CoV-2 public health emergency.  Safety protocols were in place, including screening questions prior to the visit, additional usage of staff PPE, and extensive cleaning of exam room while observing appropriate contact  time as indicated for disinfecting solutions.   Assessment & Plan:

## 2021-07-13 NOTE — Assessment & Plan Note (Signed)
Flu shot yearly. Covid-19 up to date. Tetanus up to date. Colonoscopy up to date. Counseled about sun safety and mole surveillance. Counseled about the dangers of distracted driving. Given 10 year screening recommendations.

## 2021-07-13 NOTE — Assessment & Plan Note (Signed)
Referral to plastic surgery for removal per patient preference.

## 2021-07-19 ENCOUNTER — Encounter: Payer: Self-pay | Admitting: Plastic Surgery

## 2021-07-19 ENCOUNTER — Ambulatory Visit (INDEPENDENT_AMBULATORY_CARE_PROVIDER_SITE_OTHER): Payer: No Typology Code available for payment source | Admitting: Plastic Surgery

## 2021-07-19 ENCOUNTER — Other Ambulatory Visit: Payer: Self-pay

## 2021-07-19 VITALS — BP 120/86 | HR 82 | Ht 68.0 in | Wt 204.2 lb

## 2021-07-19 DIAGNOSIS — D170 Benign lipomatous neoplasm of skin and subcutaneous tissue of head, face and neck: Secondary | ICD-10-CM | POA: Diagnosis not present

## 2021-07-20 NOTE — Progress Notes (Signed)
     Patient ID: Brian Pace, male    DOB: 12/30/1970, 50 y.o.   MRN: [id number]   Chief Complaint  Patient presents with   Advice Only    The patient is a 50 year old male here for evaluation of a mass on his neck.  The patient states that has been there for several years.  It has been stable.  Its not tender.  At that 2.5 cm in size.  It is on the left side and its in the posterior triangle.  I do not feel any lymphadenopathy or other areas of concern.  It is soft.  He is otherwise in very good health.  His past medical history and surgical history as listed below.   Review of Systems  Constitutional: Negative.   HENT: Negative.    Eyes: Negative.   Respiratory: Negative.    Cardiovascular: Negative.   Gastrointestinal: Negative.   Endocrine: Negative.   Genitourinary: Negative.   Musculoskeletal: Negative.   Skin:  Negative for rash and wound.  Neurological: Negative.    Past Medical History:  Diagnosis Date   Hyperlipidemia     Past Surgical History:  Procedure Laterality Date   COLONOSCOPY  2015     No current outpatient medications on file.   Objective:   Vitals:   07/19/21 1458  BP: 120/86  Pulse: 82  SpO2: 99%    Physical Exam Vitals and nursing note reviewed.  Constitutional:      Appearance: Normal appearance.  HENT:     Head: Normocephalic and atraumatic.   Cardiovascular:     Rate and Rhythm: Normal rate.     Pulses: Normal pulses.  Pulmonary:     Effort: Pulmonary effort is normal.  Abdominal:     General: Abdomen is flat. There is no distension.  Musculoskeletal:        General: No swelling or deformity. Normal range of motion.  Skin:    General: Skin is warm.     Capillary Refill: Capillary refill takes less than 2 seconds.     Coloration: Skin is not jaundiced.     Findings: No bruising.  Neurological:     Mental Status: He is alert and oriented to person, place, and time.  Psychiatric:        Mood and Affect: Mood normal.         Behavior: Behavior normal.    Assessment & Plan:  Lipoma of neck  Agree with excision of left neck mass with path evaluation.    Pictures were obtained of the patient and placed in the chart with the patient's or guardian's permission.   [place], DO

## 2021-07-22 ENCOUNTER — Encounter: Payer: Self-pay | Admitting: Internal Medicine

## 2021-08-18 ENCOUNTER — Encounter (HOSPITAL_BASED_OUTPATIENT_CLINIC_OR_DEPARTMENT_OTHER): Payer: Self-pay | Admitting: Plastic Surgery

## 2021-08-18 ENCOUNTER — Other Ambulatory Visit: Payer: Self-pay

## 2021-08-25 ENCOUNTER — Encounter (HOSPITAL_BASED_OUTPATIENT_CLINIC_OR_DEPARTMENT_OTHER)
Admission: RE | Disposition: A | Discharge status: Home / Self Care | Payer: Self-pay | Source: Home / Self Care | Attending: Plastic Surgery

## 2021-08-25 ENCOUNTER — Encounter (HOSPITAL_BASED_OUTPATIENT_CLINIC_OR_DEPARTMENT_OTHER): Payer: Self-pay | Admitting: Plastic Surgery

## 2021-08-25 ENCOUNTER — Ambulatory Visit (HOSPITAL_BASED_OUTPATIENT_CLINIC_OR_DEPARTMENT_OTHER): Payer: No Typology Code available for payment source | Admitting: Anesthesiology

## 2021-08-25 ENCOUNTER — Ambulatory Visit (HOSPITAL_BASED_OUTPATIENT_CLINIC_OR_DEPARTMENT_OTHER)
Admission: RE | Admit: 2021-08-25 | Discharge: 2021-08-25 | Disposition: A | Discharge status: Home / Self Care | Payer: No Typology Code available for payment source | Attending: Plastic Surgery | Admitting: Plastic Surgery

## 2021-08-25 ENCOUNTER — Other Ambulatory Visit: Payer: Self-pay

## 2021-08-25 DIAGNOSIS — R221 Localized swelling, mass and lump, neck: Secondary | ICD-10-CM | POA: Diagnosis present

## 2021-08-25 DIAGNOSIS — D170 Benign lipomatous neoplasm of skin and subcutaneous tissue of head, face and neck: Secondary | ICD-10-CM

## 2021-08-25 HISTORY — DX: Sleep apnea, unspecified: G47.30

## 2021-08-25 HISTORY — PX: LIPOMA EXCISION: SHX5283

## 2021-08-25 SURGERY — EXCISION LIPOMA
Anesthesia: General | Site: Neck | Laterality: Left

## 2021-08-25 MED ORDER — CEFAZOLIN SODIUM-DEXTROSE 2-4 GM/100ML-% IV SOLN
2.0000 g | INTRAVENOUS | Status: AC
  Administered 2021-08-25: 2 g via INTRAVENOUS

## 2021-08-25 MED ORDER — CEFAZOLIN SODIUM-DEXTROSE 2-4 GM/100ML-% IV SOLN
INTRAVENOUS | Status: AC
  Filled 2021-08-25: qty 100

## 2021-08-25 MED ORDER — CHLORHEXIDINE GLUCONATE CLOTH 2 % EX PADS: 6.0000 | MEDICATED_PAD | Freq: Once | CUTANEOUS | Status: DC

## 2021-08-25 MED ORDER — LACTATED RINGERS IV SOLN: INTRAVENOUS | Status: DC

## 2021-08-25 MED ORDER — MUPIROCIN 2 % EX OINT
TOPICAL_OINTMENT | CUTANEOUS | Status: AC
  Filled 2021-08-25: qty 22

## 2021-08-25 MED ORDER — MIDAZOLAM HCL 5 MG/5ML IJ SOLN
INTRAMUSCULAR | Status: DC | PRN
  Administered 2021-08-25: 2 mg via INTRAVENOUS

## 2021-08-25 MED ORDER — ACETAMINOPHEN 325 MG RE SUPP: 650.0000 mg | RECTAL | Status: DC | PRN

## 2021-08-25 MED ORDER — ONDANSETRON HCL 4 MG/2ML IJ SOLN
INTRAMUSCULAR | Status: DC | PRN
  Administered 2021-08-25: 4 mg via INTRAVENOUS

## 2021-08-25 MED ORDER — SODIUM CHLORIDE 0.9% FLUSH: 3.0000 mL | Freq: Two times a day (BID) | INTRAVENOUS | Status: DC

## 2021-08-25 MED ORDER — ACETAMINOPHEN 500 MG PO TABS: 1000.0000 mg | ORAL_TABLET | Freq: Once | ORAL | Status: DC

## 2021-08-25 MED ORDER — OXYCODONE HCL 5 MG PO TABS: 5.0000 mg | ORAL_TABLET | Freq: Once | ORAL | Status: DC | PRN

## 2021-08-25 MED ORDER — MIDAZOLAM HCL 2 MG/2ML IJ SOLN
INTRAMUSCULAR | Status: AC
  Filled 2021-08-25: qty 2

## 2021-08-25 MED ORDER — PROPOFOL 10 MG/ML IV BOLUS
INTRAVENOUS | Status: DC | PRN
Start: 2021-08-25 — End: 2021-08-25
  Administered 2021-08-25: 200 mg via INTRAVENOUS
  Administered 2021-08-25: 100 mg via INTRAVENOUS

## 2021-08-25 MED ORDER — FENTANYL CITRATE (PF) 100 MCG/2ML IJ SOLN: 25.0000 ug | INTRAMUSCULAR | Status: DC | PRN

## 2021-08-25 MED ORDER — ACETAMINOPHEN 500 MG PO TABS
ORAL_TABLET | ORAL | Status: AC
  Filled 2021-08-25: qty 2

## 2021-08-25 MED ORDER — FENTANYL CITRATE (PF) 100 MCG/2ML IJ SOLN
INTRAMUSCULAR | Status: DC | PRN
  Administered 2021-08-25 (×2): 50 ug via INTRAVENOUS

## 2021-08-25 MED ORDER — SODIUM CHLORIDE 0.9% FLUSH: 3.0000 mL | INTRAVENOUS | Status: DC | PRN

## 2021-08-25 MED ORDER — DEXAMETHASONE SODIUM PHOSPHATE 4 MG/ML IJ SOLN
INTRAMUSCULAR | Status: DC | PRN
  Administered 2021-08-25: 10 mg via INTRAVENOUS

## 2021-08-25 MED ORDER — BACITRACIN ZINC 500 UNIT/GM EX OINT
TOPICAL_OINTMENT | CUTANEOUS | Status: AC
  Filled 2021-08-25: qty 28.35

## 2021-08-25 MED ORDER — SODIUM CHLORIDE 0.9 % IV SOLN: 250.0000 mL | INTRAVENOUS | Status: DC | PRN

## 2021-08-25 MED ORDER — LIDOCAINE HCL (CARDIAC) PF 100 MG/5ML IV SOSY
PREFILLED_SYRINGE | INTRAVENOUS | Status: DC | PRN
  Administered 2021-08-25: 80 mg via INTRAVENOUS

## 2021-08-25 MED ORDER — ACETAMINOPHEN 325 MG PO TABS: 650.0000 mg | ORAL_TABLET | ORAL | Status: DC | PRN

## 2021-08-25 MED ORDER — OXYCODONE HCL 5 MG PO TABS: 5.0000 mg | ORAL_TABLET | ORAL | Status: DC | PRN

## 2021-08-25 MED ORDER — DROPERIDOL 2.5 MG/ML IJ SOLN: 0.6250 mg | Freq: Once | INTRAMUSCULAR | Status: DC | PRN

## 2021-08-25 MED ORDER — PROMETHAZINE HCL 25 MG/ML IJ SOLN: 6.2500 mg | INTRAMUSCULAR | Status: DC | PRN

## 2021-08-25 MED ORDER — FENTANYL CITRATE (PF) 100 MCG/2ML IJ SOLN
INTRAMUSCULAR | Status: AC
  Filled 2021-08-25: qty 2

## 2021-08-25 MED ORDER — OXYCODONE HCL 5 MG/5ML PO SOLN: 5.0000 mg | Freq: Once | ORAL | Status: DC | PRN

## 2021-08-25 MED ORDER — LIDOCAINE-EPINEPHRINE 1 %-1:100000 IJ SOLN
INTRAMUSCULAR | Status: DC | PRN
  Administered 2021-08-25: 4 mL

## 2021-08-25 SURGICAL SUPPLY — 31 items
BLADE SURG 15 STRL LF DISP TIS (BLADE) ×1 IMPLANT
BLADE SURG 15 STRL SS (BLADE) ×1
COVER BACK TABLE 60X90IN (DRAPES) ×2 IMPLANT
COVER MAYO STAND STRL (DRAPES) ×2 IMPLANT
DERMABOND ADVANCED (GAUZE/BANDAGES/DRESSINGS) ×1
DERMABOND ADVANCED .7 DNX12 (GAUZE/BANDAGES/DRESSINGS) ×1 IMPLANT
DRAPE U-SHAPE 76X120 STRL (DRAPES) ×2 IMPLANT
DRESSING MEPILEX FLEX 4X4 (GAUZE/BANDAGES/DRESSINGS) ×1 IMPLANT
DRSG MEPILEX FLEX 4X4 (GAUZE/BANDAGES/DRESSINGS) ×2
ELECT COATED BLADE 2.86 ST (ELECTRODE) ×2 IMPLANT
ELECT REM PT RETURN 9FT ADLT (ELECTROSURGICAL) ×2
ELECTRODE REM PT RTRN 9FT ADLT (ELECTROSURGICAL) ×1 IMPLANT
GLOVE SRG 8 PF TXTR STRL LF DI (GLOVE) ×1 IMPLANT
GLOVE SURG ENC MOIS LTX SZ6.5 (GLOVE) ×4 IMPLANT
GLOVE SURG ENC MOIS LTX SZ8.5 (GLOVE) ×2 IMPLANT
GLOVE SURG UNDER POLY LF SZ6.5 (GLOVE) ×4 IMPLANT
GLOVE SURG UNDER POLY LF SZ8 (GLOVE) ×1
GOWN STRL REUS W/ TWL LRG LVL3 (GOWN DISPOSABLE) ×2 IMPLANT
GOWN STRL REUS W/ TWL XL LVL3 (GOWN DISPOSABLE) ×2 IMPLANT
GOWN STRL REUS W/TWL LRG LVL3 (GOWN DISPOSABLE) ×2
GOWN STRL REUS W/TWL XL LVL3 (GOWN DISPOSABLE) ×2
NEEDLE HYPO 25X1 1.5 SAFETY (NEEDLE) ×2 IMPLANT
NS IRRIG 1000ML POUR BTL (IV SOLUTION) ×2 IMPLANT
PACK BASIN DAY SURGERY FS (CUSTOM PROCEDURE TRAY) ×2 IMPLANT
PENCIL SMOKE EVACUATOR (MISCELLANEOUS) ×2 IMPLANT
SPONGE GAUZE 2X2 8PLY STRL LF (GAUZE/BANDAGES/DRESSINGS) ×2 IMPLANT
STRIP SUTURE WOUND CLOSURE 1/2 (MISCELLANEOUS) ×2 IMPLANT
SUT MON AB 5-0 PS2 18 (SUTURE) ×2 IMPLANT
SYR CONTROL 10ML LL (SYRINGE) ×2 IMPLANT
TOWEL GREEN STERILE FF (TOWEL DISPOSABLE) ×4 IMPLANT
TRAY DSU PREP LF (CUSTOM PROCEDURE TRAY) ×2 IMPLANT

## 2021-08-25 NOTE — Anesthesia Postprocedure Evaluation (Signed)
Anesthesia Post Note  Patient: Brian Pace  Procedure(s) Performed: Excision of  left neck mass (Left: Neck)     Patient location during evaluation: PACU Anesthesia Type: General Level of consciousness: awake and alert Pain management: pain level controlled Vital Signs Assessment: post-procedure vital signs reviewed and stable Respiratory status: spontaneous breathing, nonlabored ventilation and respiratory function stable Cardiovascular status: blood pressure returned to baseline and stable Postop Assessment: no apparent nausea or vomiting Anesthetic complications: no   No notable events documented.  Last Vitals:  Vitals:   08/25/21 1445 08/25/21 1511  BP: (!) 145/82 (!) 143/90  Pulse: 88 75  Resp: (!) 21 16  Temp:  36.7 C  SpO2: 99% 94%    Last Pain:  Vitals:   08/25/21 1500  TempSrc:   PainSc: 0-No pain                 Catalina Gravel

## 2021-08-25 NOTE — Discharge Instructions (Addendum)
INSTRUCTIONS FOR AFTER SURGERY   You will likely have some questions about what to expect following your operation.  The following information will help you and your family understand what to expect when you are discharged from the hospital.  Following these guidelines will help ensure a smooth recovery and reduce risks of complications.  Postoperative instructions include information on: diet, wound care, medications and physical activity.  AFTER SURGERY Expect to go home after the procedure.  In some cases, you may need to spend one night in the hospital for observation.  DIET This surgery does not require a specific diet.  However, I have to mention that the healthier you eat the better your body can start healing. It is important to increasing your protein intake.  This means limiting the foods with added sugar.  Focus on fruits and vegetables and some meat. It is very important to drink water after your surgery.  If your urine is bright yellow, then it is concentrated, and you need to drink more water.  As a general rule after surgery, you should have 8 ounces of water every hour while awake.  If you find you are persistently nauseated or unable to take in liquids let us know.  NO TOBACCO USE or EXPOSURE.  This will slow your healing process and increase the risk of a wound.  WOUND CARE You can shower the day after surgery.  Use fragrance free soap.  Dial, [place], [place] and Cetaphil are usually mild on the skin.  If you have steri-strips / tape directly attached to your skin leave them in place. It is OK to get these wet.  No baths, pools or hot tubs for two weeks. We close your incision to leave the smallest and best-looking scar. No ointment or creams on your incisions until given the go ahead.  Especially not Neosporin (Too many skin reactions with this one).  A few weeks after surgery you can use Mederma and start massaging the scar.  ACTIVITY No heavy lifting until cleared by the doctor.  It  is OK to walk and climb stairs. In fact, moving your legs is very important to decrease your risk of a blood clot.  It will also help keep you from getting deconditioned.  Every 1 to 2 hours get up and walk for 5 minutes. This will help with a quicker recovery back to normal.  Let pain be your guide so you don't do too much.    WORK Everyone returns to work at different times. As a rough guide, most people take at least 1 - 2 weeks off prior to returning to work. If you need documentation for your job, bring the forms to your postoperative follow up visit.  DRIVING Arrange for someone to bring you home from the hospital.  You may be able to drive a few days after surgery but not while taking any narcotics or valium.  BOWEL MOVEMENTS Constipation can occur after anesthesia and while taking pain medication.  It is important to stay ahead for your comfort.  We recommend taking Milk of Magnesia (2 tablespoons; twice a day) while taking the pain pills.  SEROMA This is fluid your body tried to put in the surgical site.  This is normal but if it creates excessive pain and swelling let us know.  It usually decreases in a few weeks.  MEDICATIONS and PAIN CONTROL At your preoperative visit for you history and physical you were given the following medications: An antibiotic: Start this  medication when you get home and take according to the instructions on the bottle. Zofran 4 mg:  This is to treat nausea and vomiting.  You can take this every 6 hours as needed and only if needed. Norco (hydrocodone/acetaminophen) 5/325 mg:  This is only to be used after you have taken the motrin or the tylenol. Every 8 hours as needed. Over the counter Medication to take: Ibuprofen (Motrin) 600 mg:  Take this every 6 hours.  If you have additional pain then take 500 mg of the tylenol.  Only take the Norco after you have tried these two. Miralax or stool softener of choice: Take this according to the bottle if you take the  [place] Call your surgeon's office if any of the following occur:  Fever 101 degrees F or greater  Excessive bleeding or fluid from the incision site.  Pain that increases over time without aid from the medications  Redness, warmth, or pus draining from incision sites  Persistent nausea or inability to take in liquids  Severe misshapen area that underwent the operation.      Post Anesthesia Home Care Instructions  Activity: Get plenty of rest for the remainder of the day. A responsible individual must stay with you for 24 hours following the procedure.  For the next 24 hours, DO NOT: -Drive a car -Paediatric nurse -Drink alcoholic beverages -Take any medication unless instructed by your physician -Make any legal decisions or sign important papers.  Meals: Start with liquid foods such as gelatin or soup. Progress to regular foods as tolerated. Avoid greasy, spicy, heavy foods. If nausea and/or vomiting occur, drink only clear liquids until the nausea and/or vomiting subsides. Call your physician if vomiting continues.  Special Instructions/Symptoms: Your throat may feel dry or sore from the anesthesia or the breathing tube placed in your throat during surgery. If this causes discomfort, gargle with warm salt water. The discomfort should disappear within 24 hours.  If you had a scopolamine patch placed behind your ear for the management of post- operative nausea and/or vomiting:  1. The medication in the patch is effective for 72 hours, after which it should be removed.  Wrap patch in a tissue and discard in the trash. Wash hands thoroughly with soap and water. 2. You may remove the patch earlier than 72 hours if you experience unpleasant side effects which may include dry mouth, dizziness or visual disturbances. 3. Avoid touching the patch. Wash your hands with soap and water after contact with the patch.

## 2021-08-25 NOTE — Anesthesia Procedure Notes (Addendum)
Procedure Name: LMA Insertion Date/Time: 08/25/2021 1:54 PM Performed by: Ezequiel Kayser, CRNA Pre-anesthesia Checklist: Patient identified, Emergency Drugs available, Suction available and Patient being monitored Patient Re-evaluated:Patient Re-evaluated prior to induction Oxygen Delivery Method: Circle System Utilized Preoxygenation: Pre-oxygenation with 100% oxygen Induction Type: IV induction Ventilation: Mask ventilation without difficulty LMA: LMA inserted LMA Size: 4.0 Number of attempts: 1 Airway Equipment and Method: Bite block Placement Confirmation: positive ETCO2 Tube secured with: Tape Dental Injury: Teeth and Oropharynx as per pre-operative assessment

## 2021-08-25 NOTE — Op Note (Signed)
DATE OF OPERATION: 08/25/2021  LOCATION: [facility]  PREOPERATIVE DIAGNOSIS: Left neck mass  POSTOPERATIVE DIAGNOSIS: Same  PROCEDURE: Excision of left neck mass / lipoma 3 x 3 cm  SURGEON: Cinthya Bors [place] Samantha Olivera, DO  ASSISTANT: Jillyn Ledger, PA  EBL: nil  CONDITION: Stable  COMPLICATIONS: None  INDICATION: The patient, Brian Pace, is a 50 y.o. male born on 1971-11-07, is here for treatment of a left neck mass.   PROCEDURE DETAILS:  The patient was seen prior to surgery and marked.  The IV antibiotics were given. The patient was taken to the operating room and given a general anesthetic. A standard time out was performed and all information was confirmed by those in the room. Local with epinephrine was injected into the subcutaneous tissue in the horizontal direction for 2 cm.  Once the local had time to work a #15 blade was used to make the incision.  The mass was located and appeared like a lipoma.  It was removed.  There was a firm mass palpated more superiorly.  This appeared to be a lipoma.  The 3 x 3 cm area total was excised.  Hemostasis was achieved with electrocautery.  The dermis was closed with the 4-0 Monocryl.  Derma bond and steri strips were applied.   The patient was allowed to wake up and taken to recovery room in stable condition at the end of the case. The family was notified at the end of the case.   The advanced practice practitioner (APP) assisted throughout the case.  The APP was essential in retraction and counter traction when needed to make the case progress smoothly.  This retraction and assistance made it possible to see the tissue plans for the procedure.  The assistance was needed for blood control, tissue re-approximation and assisted with closure of the incision site.

## 2021-08-25 NOTE — Transfer of Care (Signed)
Immediate Anesthesia Transfer of Care Note  Patient: Brian Pace  Procedure(s) Performed: Excision of  left neck mass (Left: Neck)  Patient Location: PACU  Anesthesia Type:General  Level of Consciousness: drowsy  Airway & Oxygen Therapy: Patient Spontanous Breathing and Patient connected to face mask oxygen  Post-op Assessment: Report given to RN and Post -op Vital signs reviewed and stable  Post vital signs: Reviewed and stable  Last Vitals:  Vitals Value Taken Time  BP 139/73 08/25/21 1427  Temp    Pulse 84 08/25/21 1428  Resp 16 08/25/21 1428  SpO2 98 % 08/25/21 1428  Vitals shown include unvalidated device data.  Last Pain:  Vitals:   08/25/21 1311  TempSrc: Oral  PainSc: 0-No pain      Patients Stated Pain Goal: 3 (49/82/64 1583)  Complications: No notable events documented.

## 2021-08-25 NOTE — H&P (Signed)
Brian Pace is an 50 y.o. male.   Chief Complaint: Lipoma HPI: The patient is a 50 yrs old male here for excision of a left neck mass.  Past Medical History:  Diagnosis Date   Hyperlipidemia    Sleep apnea     Past Surgical History:  Procedure Laterality Date   COLONOSCOPY  2015    Family History  Problem Relation Age of Onset   Arthritis Mother    Hypertension Mother    Arthritis Father    Cancer Brother    Colon polyps Neg Hx    Colon cancer Neg Hx    Esophageal cancer Neg Hx    Stomach cancer Neg Hx    Rectal cancer Neg Hx    Social History:  reports that he has never smoked. He has never used smokeless tobacco. He reports current alcohol use of about 2.0 standard drinks per week. He reports that he does not use drugs.  Allergies: No Known Allergies  No medications prior to admission.    No results found for this or any previous visit (from the past 48 hour(s)). No results found.  Review of Systems  Constitutional: Negative.   HENT: Negative.    Eyes: Negative.   Respiratory: Negative.    Cardiovascular: Negative.   Gastrointestinal: Negative.   Endocrine: Negative.   Genitourinary: Negative.   Musculoskeletal: Negative.   Skin: Negative.    Blood pressure (!) 156/99, pulse 91, temperature 98.1 F (36.7 C), temperature source Oral, resp. rate 17, height 5\' 8"  (1.727 m), weight 93 kg, SpO2 100 %. Physical Exam Vitals and nursing note reviewed.  Constitutional:      Appearance: Normal appearance.  HENT:     Head: Normocephalic and atraumatic.  Cardiovascular:     Rate and Rhythm: Normal rate.     Pulses: Normal pulses.  Pulmonary:     Effort: Pulmonary effort is normal.  Abdominal:     General: Abdomen is flat.  Neurological:     General: No focal deficit present.     Mental Status: He is alert.  Psychiatric:        Mood and Affect: Mood normal.        Behavior: Behavior normal.        Thought Content: Thought content normal.      Assessment/Plan Left neck mass. Plan for excision.  [place], DO 08/25/2021, 1:27 PM

## 2021-08-25 NOTE — Anesthesia Preprocedure Evaluation (Addendum)
Anesthesia Evaluation  Patient identified by MRN, date of birth, ID band Patient awake    Reviewed: Allergy & Precautions, NPO status , Patient's Chart, lab work & pertinent test results  Airway Mallampati: II  TM Distance: >3 FB Neck ROM: Full    Dental no notable dental hx.    Pulmonary sleep apnea ,    Pulmonary exam normal breath sounds clear to auscultation       Cardiovascular negative cardio ROS Normal cardiovascular exam Rhythm:Regular Rate:Normal     Neuro/Psych negative neurological ROS  negative psych ROS   GI/Hepatic negative GI ROS, Neg liver ROS,   Endo/Other  hyperlipidemia  Renal/GU negative Renal ROS  negative genitourinary   Musculoskeletal negative musculoskeletal ROS (+)   Abdominal   Peds negative pediatric ROS (+)  Hematology negative hematology ROS (+)   Anesthesia Other Findings Neck lipoma  Reproductive/Obstetrics negative OB ROS                            Anesthesia Physical Anesthesia Plan  ASA: 2  Anesthesia Plan: General   Post-op Pain Management:    Induction: Intravenous  PONV Risk Score and Plan: 2 and Treatment may vary due to age or medical condition, Midazolam, Ondansetron and Dexamethasone  Airway Management Planned: Oral ETT  Additional Equipment: None  Intra-op Plan:   Post-operative Plan: Extubation in OR  Informed Consent: I have reviewed the patients History and Physical, chart, labs and discussed the procedure including the risks, benefits and alternatives for the proposed anesthesia with the patient or authorized representative who has indicated his/her understanding and acceptance.     Dental advisory given  Plan Discussed with: CRNA and Anesthesiologist  Anesthesia Plan Comments:        Anesthesia Quick Evaluation

## 2021-08-26 ENCOUNTER — Encounter (HOSPITAL_BASED_OUTPATIENT_CLINIC_OR_DEPARTMENT_OTHER): Payer: Self-pay | Admitting: Plastic Surgery

## 2021-08-29 LAB — SURGICAL PATHOLOGY

## 2021-09-19 ENCOUNTER — Telehealth: Payer: Self-pay | Admitting: *Deleted

## 2021-09-19 NOTE — Telephone Encounter (Signed)
Received Medical Managemant Request from [facility] on (09/07/2021) via of fax.  Request for results from procedure on (08/25/2021).  If no abnormalities and noted, please initial here and return.  Given to provider to complete.   Request filled out and faxed back to [facility] along with a copy of the Pathology report.  Copy scanned into the chart.//AB/CMA

## 2022-07-14 ENCOUNTER — Encounter: Payer: Self-pay | Admitting: Internal Medicine

## 2022-07-14 ENCOUNTER — Ambulatory Visit (INDEPENDENT_AMBULATORY_CARE_PROVIDER_SITE_OTHER): Payer: No Typology Code available for payment source | Admitting: Internal Medicine

## 2022-07-14 VITALS — BP 120/82 | HR 83 | Temp 98.1°F | Ht 69.6 in | Wt 212.0 lb

## 2022-07-14 DIAGNOSIS — R7303 Prediabetes: Secondary | ICD-10-CM | POA: Diagnosis not present

## 2022-07-14 DIAGNOSIS — Z136 Encounter for screening for cardiovascular disorders: Secondary | ICD-10-CM

## 2022-07-14 DIAGNOSIS — Z0000 Encounter for general adult medical examination without abnormal findings: Secondary | ICD-10-CM

## 2022-07-14 LAB — CBC
HCT: 45.5 % (ref 39.0–52.0)
Hemoglobin: 15.1 g/dL (ref 13.0–17.0)
MCHC: 33.2 g/dL (ref 30.0–36.0)
MCV: 89.4 fl (ref 78.0–100.0)
Platelets: 172 10*3/uL (ref 150.0–400.0)
RBC: 5.09 Mil/uL (ref 4.22–5.81)
RDW: 13.6 % (ref 11.5–15.5)
WBC: 3.9 10*3/uL — ABNORMAL LOW (ref 4.0–10.5)

## 2022-07-14 LAB — HEMOGLOBIN A1C: Hgb A1c MFr Bld: 6.4 % (ref 4.6–6.5)

## 2022-07-14 LAB — PSA: PSA: 0.82 ng/mL (ref 0.10–4.00)

## 2022-07-14 LAB — COMPREHENSIVE METABOLIC PANEL
ALT: 47 U/L (ref 0–53)
AST: 34 U/L (ref 0–37)
Albumin: 4.8 g/dL (ref 3.5–5.2)
Alkaline Phosphatase: 51 U/L (ref 39–117)
BUN: 15 mg/dL (ref 6–23)
CO2: 25 mEq/L (ref 19–32)
Calcium: 9.7 mg/dL (ref 8.4–10.5)
Chloride: 101 mEq/L (ref 96–112)
Creatinine, Ser: 1.24 mg/dL (ref 0.40–1.50)
GFR: 67.83 mL/min (ref >60.00)
Glucose, Bld: 85 mg/dL (ref 70–99)
Potassium: 4.4 mEq/L (ref 3.5–5.1)
Sodium: 135 mEq/L (ref 135–145)
Total Bilirubin: 0.5 mg/dL (ref 0.2–1.2)
Total Protein: 7.6 g/dL (ref 6.0–8.3)

## 2022-07-14 LAB — LIPID PANEL
Cholesterol: 235 mg/dL — ABNORMAL HIGH (ref 0–200)
HDL: 42 mg/dL (ref >39.00)
LDL Cholesterol: 156 mg/dL — ABNORMAL HIGH (ref 0–99)
NonHDL: 192.64
Total CHOL/HDL Ratio: 6
Triglycerides: 183 mg/dL — ABNORMAL HIGH (ref 0.0–149.0)
VLDL: 36.6 mg/dL (ref 0.0–40.0)

## 2022-07-14 NOTE — Progress Notes (Signed)
   Subjective:   Patient ID: Brian Pace, male    DOB: 1971-06-09, 51 y.o.   MRN: [id number]  HPI The patient is here for physical.  PMH, [facility], social history reviewed and updated  Review of Systems  Constitutional: Negative.   HENT: Negative.    Eyes: Negative.   Respiratory:  Negative for cough, chest tightness and shortness of breath.   Cardiovascular:  Negative for chest pain, palpitations and leg swelling.  Gastrointestinal:  Negative for abdominal distention, abdominal pain, constipation, diarrhea, nausea and vomiting.  Musculoskeletal: Negative.   Skin: Negative.   Neurological: Negative.   Psychiatric/Behavioral: Negative.      Objective:  Physical Exam Constitutional:      Appearance: He is well-developed.  HENT:     Head: Normocephalic and atraumatic.  Cardiovascular:     Rate and Rhythm: Normal rate and regular rhythm.  Pulmonary:     Effort: Pulmonary effort is normal. No respiratory distress.     Breath sounds: Normal breath sounds. No wheezing or rales.  Abdominal:     General: Bowel sounds are normal. There is no distension.     Palpations: Abdomen is soft.     Tenderness: There is no abdominal tenderness. There is no rebound.  Musculoskeletal:     Cervical back: Normal range of motion.  Skin:    General: Skin is warm and dry.  Neurological:     Mental Status: He is alert and oriented to person, place, and time.     Coordination: Coordination normal.     Vitals:   07/14/22 0859  BP: 120/82  Pulse: 83  Temp: 98.1 F (36.7 C)  TempSrc: Oral  SpO2: 95%  Weight: 212 lb (96.2 kg)  Height: 5' 9.6" (1.768 m)    Assessment & Plan:

## 2022-07-14 NOTE — Assessment & Plan Note (Signed)
Flu shot yearly. Covid-19 counseled. Shingrix available. Tetanus up to date. Colonoscopy up to date. Counseled about sun safety and mole surveillance. Counseled about the dangers of distracted driving. Given 10 year screening recommendations.

## 2022-07-14 NOTE — Assessment & Plan Note (Signed)
Checking HgA1c. Adjust as needed. 

## 2022-07-20 ENCOUNTER — Encounter: Payer: Self-pay | Admitting: Internal Medicine

## 2022-07-20 DIAGNOSIS — R7303 Prediabetes: Secondary | ICD-10-CM

## 2022-08-09 ENCOUNTER — Encounter: Payer: Self-pay | Admitting: Dietician

## 2022-08-09 ENCOUNTER — Encounter: Payer: No Typology Code available for payment source | Attending: Internal Medicine | Admitting: Dietician

## 2022-08-09 DIAGNOSIS — R7303 Prediabetes: Secondary | ICD-10-CM | POA: Insufficient documentation

## 2022-08-09 NOTE — Patient Instructions (Addendum)
Aim for 150 minutes of physical activity weekly.  Plan to eat at least 2 meals per day and 1 snack.   Aim to make half your plate fruits and vegetables at least twice a day.   Attempt to eat breakfast with your coffee! Something simple is better than nothing! -banana and nuts -greek yogurt and fruit  Pack a snack for work! Pair a carbohydrate and protein -Kind bar and fruit -Or eat half your 4pm meal and eat the other half later.

## 2022-08-09 NOTE — Progress Notes (Signed)
Medical Nutrition Therapy  Appointment Start time:  [phone]  Appointment End time:  [phone]  Primary concerns today: Pt is here today to learn about nutrition and prediabetes. He states he feels as though his issue is eating consistently and states he only eats once a day.    Referral diagnosis: prediabetes Preferred learning style: no preference indicated Learning readiness: ready   NUTRITION ASSESSMENT   Anthropometrics  Ht: 69in Wt: 213lbs  Clinical Medical Hx: HLD, sleep apnea with C-pap, prediabetes Medications: none Labs: Chol 235, LDL 156, TG 183, A1C 6.4% 07/14/22 Notable Signs/Symptoms: none  Lifestyle & Dietary Hx Pt lives with his wife and 2 kids. Him and his wife both do the shopping and cooking. Pt states he only eats once a day. He states in the morning he is rushed getting his kids to school but he is hungry, and then he waits until his 4pm break at work to eat.  Pt gets home from work at [employer]. He states he usually doesn't eat anymore but with occasionally have a snack of 2 chicken wings and usually drinks 1-2 beers.   Pt states he used to drink a lot of soda but stopped a year ago when he found out about his prediabetes.  Estimated daily fluid intake: 1.5L Supplements: none Sleep: 7 hours Stress / self-care: moderate stress Current average weekly physical activity: 30 min cardio 5x/wk.   24-Hr Dietary Recall First Meal: none Snack: none Second Meal: none Snack: 4pm break: jollof rice with chicken or beans (2c) Third Meal: none Snack: none Beverages: coffee with flavored creamer, water, 1-2 beers   NUTRITION DIAGNOSIS  NB-1.1 Food and nutrition-related knowledge deficit As related to prediabetes.  As evidenced by pt report and diet history.   NUTRITION INTERVENTION  Nutrition education (E-1) on the following topics:  Importance of eating consistently and not skipping meals MyPlate Insulin Resistance A1C and goals Building balanced snacks Balance of  carbohydrate, protein, fruits and vegetables Importance of physical activity  Handouts Provided Include  Dish Out a Healthy Meal Snack Sheet List of non-starchy vegetables  Learning Style & Readiness for Change Teaching method utilized: Visual & Auditory  Demonstrated degree of understanding via: Teach Back  Barriers to learning/adherence to lifestyle change: none  Goals Established by Pt Aim for 150 minutes of physical activity weekly.  Plan to eat at least 2 meals per day and 1 snack.   Aim to make half your plate fruits and vegetables at least twice a day.   Attempt to eat breakfast with your coffee! Something simple is better than nothing! -banana and nuts -greek yogurt and fruit  Pack a snack for work! Pair a carbohydrate and protein -Kind bar and fruit -Or eat half your 4pm meal and eat the other half later.    MONITORING & EVALUATION Dietary intake, weekly physical activity, and follow up as needed.  Next Steps  Patient is to call for questions.

## 2023-02-17 DIAGNOSIS — G4733 Obstructive sleep apnea (adult) (pediatric): Secondary | ICD-10-CM | POA: Diagnosis not present

## 2023-03-20 DIAGNOSIS — G4733 Obstructive sleep apnea (adult) (pediatric): Secondary | ICD-10-CM | POA: Diagnosis not present

## 2023-04-19 DIAGNOSIS — G4733 Obstructive sleep apnea (adult) (pediatric): Secondary | ICD-10-CM | POA: Diagnosis not present

## 2023-06-12 DIAGNOSIS — G4733 Obstructive sleep apnea (adult) (pediatric): Secondary | ICD-10-CM | POA: Diagnosis not present

## 2023-06-20 ENCOUNTER — Encounter (INDEPENDENT_AMBULATORY_CARE_PROVIDER_SITE_OTHER): Payer: Self-pay

## 2023-06-28 DIAGNOSIS — G4733 Obstructive sleep apnea (adult) (pediatric): Secondary | ICD-10-CM | POA: Diagnosis not present

## 2023-07-17 ENCOUNTER — Encounter: Payer: 59 | Admitting: Internal Medicine

## 2023-08-07 ENCOUNTER — Ambulatory Visit (INDEPENDENT_AMBULATORY_CARE_PROVIDER_SITE_OTHER): Payer: 59 | Admitting: Internal Medicine

## 2023-08-07 VITALS — BP 132/82 | HR 80 | Temp 98.4°F | Ht 69.0 in | Wt 204.0 lb

## 2023-08-07 DIAGNOSIS — Z23 Encounter for immunization: Secondary | ICD-10-CM | POA: Diagnosis not present

## 2023-08-07 DIAGNOSIS — Z0000 Encounter for general adult medical examination without abnormal findings: Secondary | ICD-10-CM

## 2023-08-07 DIAGNOSIS — R7303 Prediabetes: Secondary | ICD-10-CM | POA: Diagnosis not present

## 2023-08-07 LAB — LIPID PANEL
Cholesterol: 219 mg/dL — ABNORMAL HIGH (ref 0–200)
HDL: 52 mg/dL (ref >39.00)
LDL Cholesterol: 142 mg/dL — ABNORMAL HIGH (ref 0–99)
NonHDL: 166.67
Total CHOL/HDL Ratio: 4
Triglycerides: 121 mg/dL (ref 0.0–149.0)
VLDL: 24.2 mg/dL (ref 0.0–40.0)

## 2023-08-07 LAB — CBC
HCT: 46.5 % (ref 39.0–52.0)
Hemoglobin: 15.5 g/dL (ref 13.0–17.0)
MCHC: 33.2 g/dL (ref 30.0–36.0)
MCV: 88.3 fl (ref 78.0–100.0)
Platelets: 187 10*3/uL (ref 150.0–400.0)
RBC: 5.27 Mil/uL (ref 4.22–5.81)
RDW: 13.5 % (ref 11.5–15.5)
WBC: 4.5 10*3/uL (ref 4.0–10.5)

## 2023-08-07 LAB — COMPREHENSIVE METABOLIC PANEL
ALT: 24 U/L (ref 0–53)
AST: 27 U/L (ref 0–37)
Albumin: 4.5 g/dL (ref 3.5–5.2)
Alkaline Phosphatase: 52 U/L (ref 39–117)
BUN: 11 mg/dL (ref 6–23)
CO2: 29 meq/L (ref 19–32)
Calcium: 9.7 mg/dL (ref 8.4–10.5)
Chloride: 100 meq/L (ref 96–112)
Creatinine, Ser: 1.09 mg/dL (ref 0.40–1.50)
GFR: 78.59 mL/min (ref >60.00)
Glucose, Bld: 93 mg/dL (ref 70–99)
Potassium: 4.2 meq/L (ref 3.5–5.1)
Sodium: 138 meq/L (ref 135–145)
Total Bilirubin: 0.5 mg/dL (ref 0.2–1.2)
Total Protein: 7.4 g/dL (ref 6.0–8.3)

## 2023-08-07 LAB — HEMOGLOBIN A1C: Hgb A1c MFr Bld: 6.1 % (ref 4.6–6.5)

## 2023-08-07 LAB — PSA: PSA: 0.88 ng/mL (ref 0.10–4.00)

## 2023-08-07 NOTE — Assessment & Plan Note (Signed)
Checking HgA1c and discussed potential metformin for prevention if HgA1c not improving. He has added exercise and working on diet lost 8 pounds since last year.

## 2023-08-07 NOTE — Progress Notes (Signed)
Subjective:   Patient ID: Brian Pace, male    DOB: Aug 20, 1971, 52 y.o.   MRN: [id number]  HPI The patient is a 52 YO man coming in for physical.   PMH, [facility], social history reviewed and updated  Review of Systems  Constitutional: Negative.   HENT: Negative.    Eyes: Negative.   Respiratory:  Negative for cough, chest tightness and shortness of breath.   Cardiovascular:  Negative for chest pain, palpitations and leg swelling.  Gastrointestinal:  Negative for abdominal distention, abdominal pain, constipation, diarrhea, nausea and vomiting.  Musculoskeletal: Negative.   Skin: Negative.   Neurological: Negative.   Psychiatric/Behavioral: Negative.      Objective:  Physical Exam Constitutional:      Appearance: He is well-developed.  HENT:     Head: Normocephalic and atraumatic.  Cardiovascular:     Rate and Rhythm: Normal rate and regular rhythm.  Pulmonary:     Effort: Pulmonary effort is normal. No respiratory distress.     Breath sounds: Normal breath sounds. No wheezing or rales.  Abdominal:     General: Bowel sounds are normal. There is no distension.     Palpations: Abdomen is soft.     Tenderness: There is no abdominal tenderness. There is no rebound.  Musculoskeletal:     Cervical back: Normal range of motion.  Skin:    General: Skin is warm and dry.  Neurological:     Mental Status: He is alert and oriented to person, place, and time.     Coordination: Coordination normal.     Vitals:   08/07/23 1256 08/07/23 1303 08/07/23 1311 08/07/23 1334  BP: (!) 140/98 (!) 140/98 (!) 138/98 132/82  Pulse: 80     Temp: 98.4 F (36.9 C)     TempSrc: Oral     SpO2: 97%     Weight: 204 lb (92.5 kg)     Height: 5\' 9"  (1.753 m)       Assessment & Plan:  Flu and shingrix IM given at visit

## 2023-08-07 NOTE — Addendum Note (Signed)
Addended by: Levonne Lapping on: 08/07/2023 02:51 PM   Modules accepted: Orders

## 2023-08-07 NOTE — Patient Instructions (Signed)
We have given you the shingles vaccine.

## 2023-08-07 NOTE — Assessment & Plan Note (Signed)
Flu shot given. Shingrix 1st given. Tetanus up to date. Colonoscopy up to date. Counseled about sun safety and mole surveillance. Counseled about the dangers of distracted driving. Given 10 year screening recommendations.

## 2023-08-08 DIAGNOSIS — R7303 Prediabetes: Secondary | ICD-10-CM

## 2023-08-13 ENCOUNTER — Ambulatory Visit (HOSPITAL_COMMUNITY)
Admission: RE | Admit: 2023-08-13 | Discharge: 2023-08-13 | Disposition: A | Discharge status: Home / Self Care | Payer: 59 | Source: Ambulatory Visit | Attending: Internal Medicine | Admitting: Internal Medicine

## 2023-08-13 DIAGNOSIS — R7303 Prediabetes: Secondary | ICD-10-CM | POA: Insufficient documentation

## 2023-08-16 DIAGNOSIS — G4733 Obstructive sleep apnea (adult) (pediatric): Secondary | ICD-10-CM | POA: Diagnosis not present

## 2023-09-07 ENCOUNTER — Other Ambulatory Visit (HOSPITAL_COMMUNITY): Payer: Self-pay

## 2023-09-07 ENCOUNTER — Encounter: Payer: Self-pay | Admitting: Internal Medicine

## 2023-09-07 MED ORDER — AMOXICILLIN-POT CLAVULANATE 875-125 MG PO TABS
1.0000 | ORAL_TABLET | Freq: Two times a day (BID) | ORAL | 0 refills | Status: AC
  Filled 2023-09-07: qty 10, 5d supply, fill #0

## 2023-09-15 DIAGNOSIS — G4733 Obstructive sleep apnea (adult) (pediatric): Secondary | ICD-10-CM | POA: Diagnosis not present

## 2023-10-16 DIAGNOSIS — G4733 Obstructive sleep apnea (adult) (pediatric): Secondary | ICD-10-CM | POA: Diagnosis not present

## 2023-11-15 DIAGNOSIS — G4733 Obstructive sleep apnea (adult) (pediatric): Secondary | ICD-10-CM | POA: Diagnosis not present

## 2023-12-16 DIAGNOSIS — G4733 Obstructive sleep apnea (adult) (pediatric): Secondary | ICD-10-CM | POA: Diagnosis not present

## 2023-12-17 ENCOUNTER — Other Ambulatory Visit (HOSPITAL_COMMUNITY): Payer: Self-pay

## 2023-12-17 MED ORDER — AMOXICILLIN-POT CLAVULANATE 875-125 MG PO TABS
875.0000 mg | ORAL_TABLET | Freq: Two times a day (BID) | ORAL | 0 refills | Status: DC
  Filled 2023-12-17: qty 20, 10d supply, fill #0

## 2023-12-17 MED ORDER — IBUPROFEN 800 MG PO TABS
800.0000 mg | ORAL_TABLET | Freq: Four times a day (QID) | ORAL | 0 refills | Status: DC
  Filled 2023-12-17: qty 30, 8d supply, fill #0

## 2024-01-16 DIAGNOSIS — G4733 Obstructive sleep apnea (adult) (pediatric): Secondary | ICD-10-CM | POA: Diagnosis not present

## 2024-02-13 DIAGNOSIS — G4733 Obstructive sleep apnea (adult) (pediatric): Secondary | ICD-10-CM | POA: Diagnosis not present

## 2024-02-17 ENCOUNTER — Encounter: Payer: Self-pay | Admitting: Internal Medicine

## 2024-02-19 ENCOUNTER — Other Ambulatory Visit: Payer: Self-pay

## 2024-02-19 ENCOUNTER — Other Ambulatory Visit (HOSPITAL_COMMUNITY): Payer: Self-pay

## 2024-02-19 MED ORDER — AMOXICILLIN-POT CLAVULANATE 875-125 MG PO TABS
875.0000 mg | ORAL_TABLET | Freq: Two times a day (BID) | ORAL | 0 refills | Status: DC
  Filled 2024-02-19: qty 20, 10d supply, fill #0

## 2024-02-19 MED ORDER — IBUPROFEN 800 MG PO TABS
800.0000 mg | ORAL_TABLET | Freq: Four times a day (QID) | ORAL | 0 refills | Status: AC
  Filled 2024-02-19: qty 30, 8d supply, fill #0

## 2024-02-19 MED ORDER — MEFLOQUINE HCL 250 MG PO TABS
250.0000 mg | ORAL_TABLET | ORAL | 0 refills | Status: DC
  Filled 2024-02-19: qty 10, 70d supply, fill #0

## 2024-02-19 MED ORDER — MEFLOQUINE HCL 250 MG PO TABS
250.0000 mg | ORAL_TABLET | ORAL | 0 refills | Status: DC
  Filled 2024-02-19: qty 6, 42d supply, fill #0

## 2024-03-15 DIAGNOSIS — G4733 Obstructive sleep apnea (adult) (pediatric): Secondary | ICD-10-CM | POA: Diagnosis not present

## 2024-04-14 DIAGNOSIS — G4733 Obstructive sleep apnea (adult) (pediatric): Secondary | ICD-10-CM | POA: Diagnosis not present

## 2024-04-15 DIAGNOSIS — G4733 Obstructive sleep apnea (adult) (pediatric): Secondary | ICD-10-CM | POA: Diagnosis not present

## 2024-07-24 ENCOUNTER — Other Ambulatory Visit (HOSPITAL_COMMUNITY): Payer: Self-pay

## 2024-08-11 ENCOUNTER — Ambulatory Visit: Admitting: Internal Medicine

## 2024-08-11 ENCOUNTER — Encounter: Payer: Self-pay | Admitting: Internal Medicine

## 2024-08-11 VITALS — BP 142/88 | HR 105 | Temp 98.4°F | Ht 69.0 in | Wt 211.0 lb

## 2024-08-11 DIAGNOSIS — R030 Elevated blood-pressure reading, without diagnosis of hypertension: Secondary | ICD-10-CM

## 2024-08-11 DIAGNOSIS — Z23 Encounter for immunization: Secondary | ICD-10-CM | POA: Diagnosis not present

## 2024-08-11 DIAGNOSIS — Z0000 Encounter for general adult medical examination without abnormal findings: Secondary | ICD-10-CM | POA: Diagnosis not present

## 2024-08-11 DIAGNOSIS — R7303 Prediabetes: Secondary | ICD-10-CM

## 2024-08-11 DIAGNOSIS — I10 Essential (primary) hypertension: Secondary | ICD-10-CM

## 2024-08-11 HISTORY — DX: Essential (primary) hypertension: I10

## 2024-08-11 NOTE — Progress Notes (Signed)
   Subjective:   Patient ID: Brian Pace, male    DOB: [birth year], 53 y.o.   MRN: [id number]  The patient is here for physical. Pertinent topics discussed: Discussed the use of AI scribe software for clinical note transcription with the patient, who gave verbal consent to proceed. History of Present Illness Brian Pace is a 52 year old male who presents with physical and some knee stiffness.  He experiences knee pain after sitting for a while, particularly when standing up. The pain lasts for about two minutes before subsiding. There is no swelling or significant injury to the knee in the past. He has not been taking any medication for the knee pain and has reduced his exercise routine, switching from using an elliptical or treadmill to practicing Tai Chi   No new chest pain, breathing issues, or headaches. He had his eyes checked last year and was advised to monitor pressure in his right eye. He does not smoke and has had borderline blood pressure measurements; he reported having stress and caffeine intake prior to his clinic visit.  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 He follows an intermittent fasting diet, eating mostly once a day, and acknowledges a need to increase his vegetable intake. PMH, [facility], social history reviewed and updated  Review of Systems  Constitutional: Negative.   HENT: Negative.    Eyes: Negative.   Respiratory:  Negative for cough, chest tightness and shortness of breath.   Cardiovascular:  Negative for chest pain, palpitations and leg swelling.  Gastrointestinal:  Negative for abdominal distention, abdominal pain, constipation, diarrhea, nausea and vomiting.  Musculoskeletal:  Positive for arthralgias.  Skin: Negative.   Neurological: Negative.   Psychiatric/Behavioral: Negative.      Objective:  Physical Exam Constitutional:      Appearance: He is well-developed.  HENT:     Head:  Normocephalic and atraumatic.  Cardiovascular:     Rate and Rhythm: Normal rate and regular rhythm.  Pulmonary:     Effort: Pulmonary effort is normal. No respiratory distress.     Breath sounds: Normal breath sounds. No wheezing or rales.  Abdominal:     General: Bowel sounds are normal. There is no distension.     Palpations: Abdomen is soft.     Tenderness: There is no abdominal tenderness.  Musculoskeletal:     Cervical back: Normal range of motion.  Skin:    General: Skin is warm and dry.  Neurological:     Mental Status: He is alert and oriented to person, place, and time.     Coordination: Coordination normal.     Vitals:   08/11/24 1259 08/11/24 1305  BP: (!) 142/88 (!) 142/88  Pulse: (!) 105   Temp: 98.4 F (36.9 C)   TempSrc: Oral   SpO2: 98%   Weight: 211 lb (95.7 kg)   Height: 5' 9 (1.753 m)     Assessment & Plan:  Prevnar 20 given at visit

## 2024-08-11 NOTE — Assessment & Plan Note (Signed)
 Flu shot yearly. Pneumonia given 20. Shingrix  due. Tetanus up to date. Colonoscopy up to date. Counseled about sun safety and mole surveillance. Counseled about the dangers of distracted driving. Given 10 year screening recommendations.

## 2024-08-11 NOTE — Assessment & Plan Note (Signed)
 Checking HGA1c. Reviewed calcium score 0 from last year.

## 2024-08-11 NOTE — Assessment & Plan Note (Signed)
 BP is mildly elevated today he has just had 2 caffeine and stress at work. He has ability to check BP. Discussed exercise and DASH diet.

## 2024-08-12 ENCOUNTER — Ambulatory Visit: Payer: Self-pay | Admitting: Internal Medicine

## 2024-08-12 LAB — COMPREHENSIVE METABOLIC PANEL WITH GFR
ALT: 38 U/L (ref 0–53)
AST: 31 U/L (ref 0–37)
Albumin: 4.4 g/dL (ref 3.5–5.2)
Alkaline Phosphatase: 48 U/L (ref 39–117)
BUN: 10 mg/dL (ref 6–23)
CO2: 28 meq/L (ref 19–32)
Calcium: 9.5 mg/dL (ref 8.4–10.5)
Chloride: 100 meq/L (ref 96–112)
Creatinine, Ser: 1.08 mg/dL (ref 0.40–1.50)
GFR: 78.9 mL/min (ref >60.00)
Glucose, Bld: 114 mg/dL — ABNORMAL HIGH (ref 70–99)
Potassium: 4.5 meq/L (ref 3.5–5.1)
Sodium: 135 meq/L (ref 135–145)
Total Bilirubin: 0.5 mg/dL (ref 0.2–1.2)
Total Protein: 6.9 g/dL (ref 6.0–8.3)

## 2024-08-12 LAB — LIPID PANEL
Cholesterol: 214 mg/dL — ABNORMAL HIGH (ref 0–200)
HDL: 41.1 mg/dL (ref >39.00)
LDL Cholesterol: 144 mg/dL — ABNORMAL HIGH (ref 0–99)
NonHDL: 173.29
Total CHOL/HDL Ratio: 5
Triglycerides: 147 mg/dL (ref 0.0–149.0)
VLDL: 29.4 mg/dL (ref 0.0–40.0)

## 2024-08-12 LAB — CBC
HCT: 44.4 % (ref 39.0–52.0)
Hemoglobin: 14.8 g/dL (ref 13.0–17.0)
MCHC: 33.4 g/dL (ref 30.0–36.0)
MCV: 87.8 fl (ref 78.0–100.0)
Platelets: 168 K/uL (ref 150.0–400.0)
RBC: 5.06 Mil/uL (ref 4.22–5.81)
RDW: 13.6 % (ref 11.5–15.5)
WBC: 4.5 K/uL (ref 4.0–10.5)

## 2024-08-12 LAB — HEMOGLOBIN A1C: Hgb A1c MFr Bld: 6.5 % (ref 4.6–6.5)

## 2024-08-12 LAB — PSA: PSA: 0.92 ng/mL (ref 0.10–4.00)

## 2024-08-13 NOTE — Telephone Encounter (Signed)
 Patient declines speaking with a nutritionist
